# Patient Record
Sex: Male | Born: 1985 | Race: White | Hispanic: No | Marital: Single | Smoking: Former smoker
Health system: Southern US, Community
[De-identification: ages and names within clinical notes are randomized; demographics above are authoritative.]

## PROBLEM LIST (undated history)

## (undated) DIAGNOSIS — F419 Anxiety disorder, unspecified: Secondary | ICD-10-CM

## (undated) DIAGNOSIS — K5090 Crohn's disease, unspecified, without complications: Secondary | ICD-10-CM

## (undated) HISTORY — DX: Crohn's disease, unspecified, without complications: K50.90

## (undated) HISTORY — DX: Anxiety disorder, unspecified: F41.9

## (undated) HISTORY — PX: SMALL INTESTINE SURGERY: SHX150

---

## 2008-02-23 HISTORY — PX: MOUTH SURGERY: SHX715

## 2010-06-10 ENCOUNTER — Ambulatory Visit (INDEPENDENT_AMBULATORY_CARE_PROVIDER_SITE_OTHER): Payer: Self-pay | Admitting: Gastroenterology

## 2010-06-10 ENCOUNTER — Encounter: Payer: Self-pay | Admitting: Gastroenterology

## 2010-06-10 DIAGNOSIS — R109 Unspecified abdominal pain: Secondary | ICD-10-CM

## 2010-06-10 NOTE — Progress Notes (Signed)
Referring Provider: [facility] Primary Care Physician:   Primary Gastroenterologist:  Dr. Jena Gauss  Chief Complaint  Patient presents with  . Crohn's Disease    new diagnosis, possible CDIFF    HPI:  Shane Stone is a 25 y.o. male here as a referral from [facility] to establish care for new diagnosis of Crohn's. Pt states one week ago presented to [facility] with abdominal/back pain/LLQ discomfort. Severe. +diarrhea when presented to ED. 2 prior episodes of moderate amount of paper hematochezia and in stool. Now reports 1 BM per day, occasional nausea. No diarrhea. Reportedly, a colonoscopy was performed at [facility] as well as CT. Do not have any of these records at time of visit. Was started on entocort 9 mg and pentasa. Avoiding NSAIDs, yet he was taking ibuprofen prior to hospital admission. Denies lack of appetite or wt loss.  Pain is slightly improved since d/c. However, he still have intermittent discomfort. Had been taking percocet 3 tabs for about a day. Concerned this may be too much. Reviewed with pt tylenol guidelines as well as percocet administration.   Past Medical History  Diagnosis Date  . Crohn's     Past Surgical History  Procedure Date  . Mouth surgery 2010    Current Outpatient Prescriptions  Medication Sig Dispense Refill  . budesonide (ENTOCORT EC) 3 MG 24 hr capsule Take 9 mg by mouth every morning.        . mesalamine (PENTASA) 500 MG CR capsule Take 1,000 mg by mouth 4 (four) times daily.        Marland Kitchen oxyCODONE-acetaminophen (PERCOCET) 5-325 MG per tablet Take 2 tablets by mouth every 4 (four) hours as needed.          Allergies as of 06/10/2010  . (No Known Allergies)    Family History  Problem Relation Age of Onset  . Colon cancer Neg Hx   . Crohn's disease Neg Hx   . Inflammatory bowel disease Neg Hx     History   Social History  . Marital Status: Single    Spouse Name: N/A    Number of Children: N/A  . Years of Education: N/A    Occupational History  . Not on file.   Social History Main Topics  . Smoking status: Current Everyday Smoker -- 1.0 packs/day for 9 years    Types: Cigarettes  . Smokeless tobacco: Never Used  . Alcohol Use: No  . Drug Use: No  . Sexually Active: Yes -- Male partner(s)    Birth Control/ Protection: Condom     Ex-girlfriend    Review of Systems: Gen: Denies any fever, chills, sweats, anorexia, fatigue, weakness, malaise, weight loss, and sleep disorder CV: Denies chest pain, angina, palpitations, syncope, orthopnea, PND, peripheral edema, and claudication. Resp: Denies dyspnea at rest, dyspnea with exercise, cough, sputum, wheezing, coughing up blood, and pleurisy. GI: Denies vomiting blood, jaundice, and fecal incontinence.   Denies dysphagia or odynophagia. GU : Denies urinary burning, blood in urine, urinary frequency, urinary hesitancy, nocturnal urination, and urinary incontinence. MS: Denies joint pain, limitation of movement, and swelling, stiffness, low back pain, extremity pain. Denies muscle weakness, cramps, atrophy.  Derm: Denies rash, itching, dry skin, hives, moles, warts, or unhealing ulcers.  Psych: Denies depression, anxiety, memory loss, suicidal ideation, hallucinations, paranoia, and confusion. Heme: Denies bruising, bleeding, and enlarged lymph nodes.  Physical Exam: BP 129/79  Pulse 85  Temp 98.2 F (36.8 C)  Ht 5\' 7"  (1.702 m)  Wt 138  lb (62.596 kg)  BMI 21.61 kg/m2 General:   Alert,  Well-developed, well-nourished, pleasant and cooperative in NAD Head:  Normocephalic and atraumatic. Eyes:  Sclera clear, no icterus.   Conjunctiva pink. Ears:  Normal auditory acuity. Nose:  No deformity, discharge,  or lesions. Mouth:  No deformity or lesions, dentition normal. Lungs:  Clear throughout to auscultation.   No wheezes, crackles, or rhonchi. No acute distress. Heart:  Regular rate and rhythm; no murmurs, clicks, rubs,  or gallops. Abdomen:  Soft,  nontender and nondistended. No masses, hepatosplenomegaly or hernias noted. Normal bowel sounds, without guarding, and without rebound.   Msk:  Symmetrical without gross deformities. Normal posture. Extremities:  Without clubbing or edema. Neurologic:  Alert and  oriented x4;  grossly normal neurologically. Skin:  Intact without significant lesions or rashes. Cervical Nodes:  No significant cervical adenopathy. Psych:  Alert and cooperative. Normal mood and affect.

## 2010-06-10 NOTE — Patient Instructions (Signed)
We will retrieve the records from [place].  Continue on your current medications; we will see you back in 4 weeks.  You have been given a prescription for pain medication. If you have any increased abdominal pain, nausea or vomiting, fever, you need to seek medical attention immediately.  Follow a clear liquid diet for another 5-7 days. This will give your bowels a rest. After this, you may advance slowly to a low-residue diet.  Return in 4 weeks.

## 2010-06-15 ENCOUNTER — Telehealth: Payer: Self-pay

## 2010-06-15 DIAGNOSIS — R109 Unspecified abdominal pain: Secondary | ICD-10-CM | POA: Insufficient documentation

## 2010-06-15 NOTE — Progress Notes (Signed)
Cc to PCP 

## 2010-06-15 NOTE — Telephone Encounter (Signed)
Pt called and said he has been having very bad abd pain and his abdomen is hard as a rock. He requested a call from Gerrit Halls, NP.

## 2010-06-15 NOTE — Telephone Encounter (Signed)
Spoke with pt. He states his abdomen is "as hard as a brick". Rates abdominal pain 8/10. Diffuse. "stabbing". Denies constipation. BM once per day. NO rectal bleeding. No nausea. +flatus. Afebrile.  Instructed needed to have medical evaluation. Recommended AAS here. Pt unsure if can come here. Instructed to then go to ED in [place].   Pt will call back with his decision after speaking with his mom, who is his transportation. Informed it was imperative he be evaluated in light of his symptoms. Stated understanding.

## 2010-06-15 NOTE — Assessment & Plan Note (Signed)
25 year old Caucasian male with recent admission to [facility] with suspected Crohn's. No records currently available at the time of visit. Pt feels somewhat improved since admission, but still complaining of intermittent discomfort. On entocort 9 mg and pentasa. Denies loose stools. 1 BM /day. No melena or brbpr. Need records in order to proceed with further recommendations.  Retrieve records from [place]. In the interim, do the following: Clear liquid diet for another 5-7 days, then low residue diet Continue current medications Oxycodone 5 mg tabs, take 1-2 tabs po q 4-6 hours, disp: 30 with no refills provided to pt.  Seek medical attention immediately if increased abdominal pain, N/V, fever. Further rec's to follow in the very near future regarding management Return in 4 weeks.

## 2010-06-16 ENCOUNTER — Telehealth: Payer: Self-pay | Admitting: Gastroenterology

## 2010-06-16 ENCOUNTER — Emergency Department (HOSPITAL_COMMUNITY)
Admission: EM | Admit: 2010-06-16 | Discharge: 2010-06-16 | Disposition: A | Discharge status: Home / Self Care | Payer: Self-pay | Attending: Emergency Medicine | Admitting: Emergency Medicine

## 2010-06-16 ENCOUNTER — Emergency Department (HOSPITAL_COMMUNITY): Payer: Self-pay

## 2010-06-16 DIAGNOSIS — Z79899 Other long term (current) drug therapy: Secondary | ICD-10-CM | POA: Insufficient documentation

## 2010-06-16 DIAGNOSIS — K5080 Crohn's disease of both small and large intestine without complications: Secondary | ICD-10-CM | POA: Insufficient documentation

## 2010-06-16 DIAGNOSIS — R109 Unspecified abdominal pain: Secondary | ICD-10-CM | POA: Insufficient documentation

## 2010-06-16 LAB — COMPREHENSIVE METABOLIC PANEL
ALT: 10 U/L (ref 0–53)
AST: 10 U/L (ref 0–37)
Albumin: 3 g/dL — ABNORMAL LOW (ref 3.5–5.2)
Alkaline Phosphatase: 92 U/L (ref 39–117)
BUN: 8 mg/dL (ref 6–23)
CO2: 27 mEq/L (ref 19–32)
Calcium: 9.5 mg/dL (ref 8.4–10.5)
Chloride: 101 mEq/L (ref 96–112)
Creatinine, Ser: 0.67 mg/dL (ref 0.4–1.5)
GFR calc Af Amer: >60 mL/min (ref >60)
GFR calc non Af Amer: >60 mL/min (ref >60)
Glucose, Bld: 110 mg/dL — ABNORMAL HIGH (ref 70–99)
Potassium: 4 mEq/L (ref 3.5–5.1)
Sodium: 138 mEq/L (ref 135–145)
Total Bilirubin: 0.2 mg/dL — ABNORMAL LOW (ref 0.3–1.2)
Total Protein: 8.1 g/dL (ref 6.0–8.3)

## 2010-06-16 LAB — DIFFERENTIAL
Basophils Absolute: 0 10*3/uL (ref 0.0–0.1)
Basophils Relative: 0 % (ref 0–1)
Eosinophils Absolute: 0 10*3/uL (ref 0.0–0.7)
Eosinophils Relative: 0 % (ref 0–5)
Lymphocytes Relative: 16 % (ref 12–46)
Lymphs Abs: 1.7 10*3/uL (ref 0.7–4.0)
Monocytes Absolute: 0.5 10*3/uL (ref 0.1–1.0)
Monocytes Relative: 5 % (ref 3–12)
Neutro Abs: 8.3 10*3/uL — ABNORMAL HIGH (ref 1.7–7.7)
Neutrophils Relative %: 79 % — ABNORMAL HIGH (ref 43–77)

## 2010-06-16 LAB — CBC
HCT: 38.6 % — ABNORMAL LOW (ref 39.0–52.0)
Hemoglobin: 12.4 g/dL — ABNORMAL LOW (ref 13.0–17.0)
MCH: 27.1 pg (ref 26.0–34.0)
MCHC: 32.1 g/dL (ref 30.0–36.0)
MCV: 84.5 fL (ref 78.0–100.0)
Platelets: 542 10*3/uL — ABNORMAL HIGH (ref 150–400)
RBC: 4.57 MIL/uL (ref 4.22–5.81)
RDW: 13.5 % (ref 11.5–15.5)
WBC: 10.5 10*3/uL (ref 4.0–10.5)

## 2010-06-16 NOTE — Telephone Encounter (Signed)
Received records from [place]. It appears no firm diagnosis was made regarding Crohn's disease. No biopsy done at time of colonoscopy, in fact, findings of colonoscopy were normal, but the TI was not accessed. Unclear why. Also appears  C-diff positive.  Heme + CT with contrast: interval inflammation with mural thickening and adjacent mesenteric edema of multiple loops of ileum to include terminal ileum. Lesser inflammation sigmoid colon.  Hgb 4/17: 10.4, Hct 32.3  Contact pt at home to get progress report. Presented to ED this morning. AAS without significant findings. Afebrile. Abdominal pain improved. Waxes and wanes. Informed most likely we would be repeating a colonoscopy in the very near future in order to obtain actual biopsies. I told pt this would be discussed tomorrow morning with Dr. Jena Gauss. Stated understanding. He is in no distress. On the way to a ball game.

## 2010-06-17 ENCOUNTER — Encounter: Payer: Self-pay | Admitting: Gastroenterology

## 2010-06-17 NOTE — Progress Notes (Unsigned)
Discussed pt's case with Dr. Jena Gauss. We will proceed with CT abd/pelvis here at [facility]. Hopefully, will be able to compare images done at [facility]. Ultimately, pt will need to have a colonoscopy. First, need to assess for any acute issues prior to proceeding with invasive approach.  1. Please set pt up for a CT scan THIS week.  2. Obtain actual films from [facility] (CT) 3. Inform pt of plan regarding CT first, then likely setting up colonoscopy next week. If CT done this week and ok, will tentatively plan on colonoscopy next week.

## 2010-06-18 NOTE — Progress Notes (Signed)
CT requested on a disk

## 2010-06-19 NOTE — Progress Notes (Signed)
Tried to call pt- number would not go through 

## 2010-06-23 ENCOUNTER — Encounter: Payer: Self-pay | Admitting: Gastroenterology

## 2010-06-23 NOTE — Progress Notes (Unsigned)
We need to send pt a letter. He needs to have a CT of his abd/pelvis as soon as possible. He is scheduled to see me May 10th. Please inform him it is imperative that he follows through with this. I know we have tried to contact him several times. He ultimately needs a colonoscopy.

## 2010-06-23 NOTE — Progress Notes (Signed)
Tried to call pt- LMOM 

## 2010-06-24 NOTE — Progress Notes (Signed)
Tried to call pt- call would not go through. Will mail letter.

## 2010-06-24 NOTE — Progress Notes (Signed)
Sent letter to pt, asked him to call back and ask for Crystal to schedule CT.

## 2010-06-24 NOTE — Progress Notes (Signed)
Sent letter to pt

## 2010-06-29 ENCOUNTER — Other Ambulatory Visit: Payer: Self-pay | Admitting: Internal Medicine

## 2010-06-29 ENCOUNTER — Encounter: Payer: Self-pay | Admitting: Gastroenterology

## 2010-06-29 ENCOUNTER — Ambulatory Visit (HOSPITAL_COMMUNITY)
Admission: RE | Admit: 2010-06-29 | Discharge: 2010-06-29 | Disposition: A | Discharge status: Home / Self Care | Payer: Self-pay | Source: Ambulatory Visit | Attending: Internal Medicine | Admitting: Internal Medicine

## 2010-06-29 ENCOUNTER — Telehealth: Payer: Self-pay

## 2010-06-29 DIAGNOSIS — K5090 Crohn's disease, unspecified, without complications: Secondary | ICD-10-CM | POA: Insufficient documentation

## 2010-06-29 DIAGNOSIS — R933 Abnormal findings on diagnostic imaging of other parts of digestive tract: Secondary | ICD-10-CM | POA: Insufficient documentation

## 2010-06-29 DIAGNOSIS — R109 Unspecified abdominal pain: Secondary | ICD-10-CM | POA: Insufficient documentation

## 2010-06-29 MED ORDER — IOHEXOL 300 MG/ML  SOLN
100.0000 mL | Freq: Once | INTRAMUSCULAR | Status: AC | PRN
  Administered 2010-06-29: 100 mL via INTRAVENOUS

## 2010-06-29 NOTE — Progress Notes (Signed)
Pt is scheduled for STAT CT today- he is aware
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
## 2010-06-29 NOTE — Telephone Encounter (Signed)
Pt instructed to obtain CT today. Pentasa, Entocort written for pt. Vicodin 5/500 mg 1 po q 6 hours prn pain, disp# 30 given as well. We will review CT when available today and contact pt with further instructions.

## 2010-06-29 NOTE — Progress Notes (Unsigned)
Reviewed CT with Dr. Jena Gauss. Pt ultimately needs colonoscopy for biopsies. Most likely, as CT reported, active Crohn's disease. Needs to remain on Entocort for now. Pentasa will likely be stopped after colonoscopy completed. Due to possible fistula, pt will need biological agent in the future. In the interim, please do the following:  1. Obtain PPD test (in anticipation of upcoming therapy) 2. Set up colonoscopy this week or next 3. Tell pt to stick to low-residue diet. If needed, back down to clear liquid diet.  4. Any acute N/V, increased abdominal pain, abdominal distention, seek medical attention.

## 2010-06-30 ENCOUNTER — Encounter: Payer: Self-pay | Admitting: Internal Medicine

## 2010-06-30 ENCOUNTER — Telehealth: Payer: Self-pay

## 2010-06-30 NOTE — Progress Notes (Signed)
Pt is scheduled for TCS on 07/07/10- instructions mailed

## 2010-06-30 NOTE — Progress Notes (Signed)
Pt aware, stated the vicodin is not doing him any good and it requesting a different pain med. Advised if pain was too bad he needed to go to ED. Please advise.

## 2010-06-30 NOTE — Telephone Encounter (Signed)
RMR wrote rx for oxycodone 7.5 #30. Made copy to be scanned into [facility]. Tried to call pt to inform him and his cell phone was turned off.

## 2010-06-30 NOTE — Progress Notes (Signed)
Pt is aware of his OV for 07/02/10 at 3 pm with AS

## 2010-07-01 MED ORDER — OXYCODONE-ACETAMINOPHEN 10-325 MG PO TABS: 1.0000 | ORAL_TABLET | ORAL | Status: AC | PRN

## 2010-07-01 NOTE — Telephone Encounter (Signed)
Pt picked up rx yesterday and took to pharmacy- oxycodone not available in 7.5. Pt is requesting oxycodone combined with 325mg  tylenol. Spoke with RMR- ok to have LSL or KJ write for oxycodone 10/325mg  1 po q4 hours prn #30, 0RF. Pt aware and will come and pick up rx.

## 2010-07-01 NOTE — Telephone Encounter (Signed)
Forwarding to RMR to [employer]

## 2010-07-01 NOTE — Telephone Encounter (Signed)
Pt picked up rx

## 2010-07-01 NOTE — Telephone Encounter (Signed)
Rx printed & signed.  Please have pt pick up.

## 2010-07-02 ENCOUNTER — Telehealth: Payer: Self-pay

## 2010-07-02 ENCOUNTER — Ambulatory Visit (INDEPENDENT_AMBULATORY_CARE_PROVIDER_SITE_OTHER): Payer: Self-pay | Admitting: Gastroenterology

## 2010-07-02 DIAGNOSIS — K5090 Crohn's disease, unspecified, without complications: Secondary | ICD-10-CM

## 2010-07-02 NOTE — Telephone Encounter (Addendum)
KJ wrote Rx and pt came and picked it up.  Message copied by Hendricks Limes on Thu Jul 02, 2010 10:02 AM ------      Message from: Roetta Sessions      Created: Wed Jul 01, 2010  3:37 PM       Ok; lets go with oxycodone 10 mg with acetaminophen 325 mg tabs - #30 - one po every 4 hrs prn pain//no refills.      ----- Message -----         From: Evalee Mutton, LPN         Sent: 07/01/2010   8:38 AM           To: Corbin Ade, MD            Pt called- pharmacy told him that there was only 5mg  and 10mg  oxycodone, unless you add the tylenol. They need to know what we want to do. Please advise.

## 2010-07-06 ENCOUNTER — Other Ambulatory Visit: Payer: Self-pay | Admitting: General Practice
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
## 2010-07-06 NOTE — Telephone Encounter (Signed)
Pt called on Friday 07/03/10 at 1:37pm and left voicemail- pt stated he was going to come by to pick up his paperwork about his procedure on Tuesday but he doesn't have his car and is waiting for someone to bring him up here.  Stated he didn't have his pain and stomach medicine because his sisters boyfriend had it in his car and he lives in [place]. He is requesting refills. Pt was just given new rxs by Tobi Bastos for the entocort and the pentasa last week along with Vicodin. RMR gave pt Oxycodone last week. Pt is scheduled for tcs on Tuesday 07/07/10 at 12:45.

## 2010-07-06 NOTE — Telephone Encounter (Signed)
07/02/10-   I spoke with pt regarding his instructions for his TCS, he stated that the mailing address that he gave [place] was not where he had received his mail. I changed the address and sent out new instructions. I also asked Shane Stone to come in the office on 07/03/10 so I could go over the prep instructions in person because I wanted to make sure he had his prep instructions and Rx, he stated that he would be be by. I informed him that he needed to come by between 8-12, he stated his understanding.

## 2010-07-07 ENCOUNTER — Telehealth: Payer: Self-pay

## 2010-07-07 ENCOUNTER — Telehealth: Payer: Self-pay | Admitting: Internal Medicine

## 2010-07-07 ENCOUNTER — Encounter: Payer: Self-pay | Admitting: Internal Medicine

## 2010-07-07 NOTE — Telephone Encounter (Signed)
Shane Stone, please see RMR note.

## 2010-07-07 NOTE — Telephone Encounter (Signed)
Pt called wanting more pain meds. He started crying, stating he was in pain. I advised pt to go to ED. I also spoke with Tobi Bastos who agreed with this plan. Pt stated he would.

## 2010-07-07 NOTE — Telephone Encounter (Signed)
He can be re-scheduled; I feel there is a compliance issue here; if he cancels out/ no shows one more time, he should probably pursue his GI care elsewhere.

## 2010-07-07 NOTE — Telephone Encounter (Signed)
Pt called and stated he he did not have transportation & wanted to R/S his procedure. He is now scheduled for 07/13/10. I went over instructions with him again and advised him I had mailed him 2 different copies of the instructions and also faxed a copy over with his Rx to [facility]. Shane Stone stated his understanding. He also stated he wasn't sure why he needed to have the TCS in the first place in order to get his pain meds but stated he would be there on 07/13/10.

## 2010-07-07 NOTE — Telephone Encounter (Signed)
Pt called- left voicemail- he stated he cannot get anyone to bring him to the hospital or take him home. Pt stated he wanted to reschedule his tcs.

## 2010-07-08 NOTE — Progress Notes (Signed)
Opened in error

## 2010-07-13 ENCOUNTER — Encounter: Payer: Self-pay | Admitting: Internal Medicine

## 2010-07-13 ENCOUNTER — Encounter: Payer: Self-pay | Admitting: Gastroenterology

## 2010-07-13 ENCOUNTER — Other Ambulatory Visit: Payer: Self-pay | Admitting: Internal Medicine

## 2010-07-13 ENCOUNTER — Ambulatory Visit (HOSPITAL_COMMUNITY)
Admission: RE | Admit: 2010-07-13 | Discharge: 2010-07-13 | Disposition: A | Discharge status: Home / Self Care | Payer: Self-pay | Source: Ambulatory Visit | Attending: Internal Medicine | Admitting: Internal Medicine

## 2010-07-13 DIAGNOSIS — K5090 Crohn's disease, unspecified, without complications: Secondary | ICD-10-CM

## 2010-07-13 DIAGNOSIS — R1031 Right lower quadrant pain: Secondary | ICD-10-CM

## 2010-07-13 DIAGNOSIS — K5289 Other specified noninfective gastroenteritis and colitis: Secondary | ICD-10-CM

## 2010-07-13 DIAGNOSIS — R634 Abnormal weight loss: Secondary | ICD-10-CM | POA: Insufficient documentation

## 2010-07-13 DIAGNOSIS — R197 Diarrhea, unspecified: Secondary | ICD-10-CM | POA: Insufficient documentation

## 2010-07-13 NOTE — Progress Notes (Unsigned)
  Pt underwent colonoscopy today with biopsies. Will need to start Remicade. Pt was already instructed to obtain PPD; we do not have these results as of today. Prior to initiating treatment, need the following:  1. PPD results for our review (scan into chart as well) 2. HBsAg results  Will need CBC, HFP at baseline, then after initial infusion the following: CBC every 2 weeks X 2, then 1 month, then 3 months.  Will dose 5mg /kg at 0, 2, 6 weeks. Then, 8 weeks thereafter.  Dose: 310 mg IV.

## 2010-07-14 NOTE — Progress Notes (Signed)
Pt is aware, he cannot get tb test done until he gets paid on June 2nd. He stated his last tb test was done last year.  Advised him to let us know when he gets it done and have results sent to [place]. Informed him of labs and got address where I can mail it and he will get it. ([address] [place], [place] [address]) He also gave me another phone number [phone] and stated we can leave messages or speak with his aunt- Mila Homer.

## 2010-07-14 NOTE — Op Note (Signed)
NAME:  Shane Stone, Shane Stone               ACCOUNT NO.:  [id number]  MEDICAL RECORD NO.:  [id number]           PATIENT TYPE:  O  LOCATION:  DAYP                          FACILITY:  [facility]  PHYSICIAN:  R. Roetta Sessions, M.D. DATE OF BIRTH:  21-Feb-1986  DATE OF PROCEDURE:  07/13/2010 DATE OF DISCHARGE:                              OPERATIVE REPORT   SURGEON:  R. Roetta Sessions, MD  PROCEDURE:  Ileal colonoscopy with biopsy.  INDICATIONS FOR PROCEDURE:  A 25 year old gentleman with weight loss, right lower quadrant abdominal pain, some diarrhea over the past few months, admitted to [facility] in [place].  Recently CT x2 demonstrated inflammatory changes about the ileum concerning for Crohn disease.  Colonoscopy at [facility] was unyielding as far as the ileum is concerned as the GI was not intubated.  He is not significantly improved on Pentasa and Entocort.  Ileal colonoscopy is now being done to substantiate the diagnosis before pursuing biologic therapy.  PPD skin test is in progress.  Risks, benefits, limitations, alternatives, and imponderables have been reviewed, questions were answered.  Please see the documentation of the medical record.  PROCEDURE NOTE:  O2 saturation, blood pressure, pulse, and respirations were monitored throughout the entirety of the procedure.  CONSCIOUS SEDATION: 1. Versed 12 mg IV. 2. Demerol 200 mg IV. 3. Phenergan 25 mg dilute slow IV push to augment conscious sedation.  INSTRUMENT:  Pentax video chip system, pediatric colonoscope.  FINDINGS:  Digital rectal exam revealed no abnormalities.  Endoscopic findings:  Prep was adequate.  Colon:  Colonic mucosa was surveyed from the rectosigmoid junction through the left transverse right colon to the appendiceal orifice, ileocecal valve/cecum.  These structures were well seen and photographed for the record.  The terminal ileum was intubated to good 15 cm.  Examination of the terminal ileum revealed  multiple 1-2 mm ulcers from the orifice of the ileocecal valve up to about 10-15 cm, at which level, there was a marked stricture formation, mark compromise of the lumen until approximately 3-4 mm.  I could not advance the scope up into the stricture.  I passed the biopsy forceps into the stricture, took multiple biopsies and biopsied the ulcers in the more distal terminal ileum.  The scope was pulled back into the colon where the colon was reexamined and the colon along its entire length appeared normal, mucosa appeared entirely normal.  Scope was pulled down into the rectum where through examination of the rectal mucosa including retroflexed view of the anal verge demonstrated no abnormalities.  The patient tolerated the procedure well.  Cecal withdrawal time 11 minutes.  IMPRESSION:  Normal rectum and colon, markedly abnormal terminal ileum as described above, status post biopsy.  I suspect we are dealing with Crohn disease.  Wrap of PPD.  PLAN:  Biologic therapy in the way of Remicade, may also consider steroid-sparing therapy in the way of Imuran or 6-MP in the near future. Further recommendations to follow pending review of path.     Jonathon Bellows, M.D.     RMR/MEDQ  D:  07/13/2010  T:  07/14/2010  Job:  [id number]  Electronically Signed by Lorrin Goodell M.D. on 07/14/2010 07:40:55 AM

## 2010-07-15 NOTE — Progress Notes (Signed)
Pt called- stated RMR gave him some pain pills after his procedure and he had to double up on them because of the pain. I explained to pt that we needed ppd done asap and once he started on remicade he would feel better and not need the pain pills anymore. Pt stated he was going to health department for ppd on June 4th because he will have some money by then. Pt does not have a pcp. I called health dept today and they told me it was $20.00 for ppd. I informed pt of that. He does not have any insurance, pt said he has a trust fund that pays his medical bills. I am working on getting pt remicade through a specialty pharmacy that will help with pt assistance. Pt is requesting a refill on his pain meds and would like [facility] [place]. Please advise

## 2010-07-15 NOTE — Progress Notes (Signed)
Agree. Pt needs to complete labs/PPD as already instructed. Pt was instructed on PPD a few weeks ago. We will give short course of vicodin. May call in vicodin 5/500 1 tab po every 4-6 hours prn pain. Disp# 30. No refills. Do not exceed 4g/day of tylenol.   Pt needs to start on therapy as soon as possible. Please suggest that he borrow the 20$ from family/friends in order to obtain the PPD, labs. I am unsure how much the pain medication costs out of pocket, but he can not be managed without appropriate therapy.

## 2010-07-16 NOTE — Progress Notes (Signed)
Pt aware, he will see if he can do ppd sooner. I have faxed rx info to [facility] to see about pt getting assistance with cost. vicodin rx [facility].

## 2010-07-21 ENCOUNTER — Other Ambulatory Visit: Payer: Self-pay | Admitting: Gastroenterology

## 2010-07-21 ENCOUNTER — Telehealth: Payer: Self-pay

## 2010-07-21 DIAGNOSIS — K5090 Crohn's disease, unspecified, without complications: Secondary | ICD-10-CM

## 2010-07-21 MED ORDER — OXYCODONE-ACETAMINOPHEN 10-325 MG PO TABS: 1.0000 | ORAL_TABLET | Freq: Four times a day (QID) | ORAL | Status: AC | PRN

## 2010-07-21 NOTE — Telephone Encounter (Signed)
I spoke with pt at length today. I informed him that he has active disease and needs to be started on Remicade; however, this can not be done without first proceeding with a PPD and baseline labs. I informed him that he is putting himself at risk and discussed possible future complications in detail. This seemed to alert him to the need for following through on our recommendations, as he began to ask more questions regarding the PPD test and the labs needed.    At one point in the conversation, he stated that he could pay the 20$ for the PPD test; however, he had told us previously he was unable to complete until June 4th or 5th. I asked about his method of payment, and he states he uses a trust fund and could give [place] the name and phone # of the man who resides over this. I asked if he could use that money for the PPD test. Again, he stated he could not come until next week, regardless.  I informed him that I could not prescribe anything over the phone stronger than the Vicodin he had been receiving. Again, I told him that starting on treatments would greatly reduce his need for pain medication and actively treat the disease that is causing so much discomfort. He was told that if he needed something stronger, it would require him picking this up. I said if he is coming this way to pick the rx up, he should find a way somehow to stop by the health department for the PPD test, ultimately saving him/his friends gas money. I asked again that he borrow 20$ from a friend if necessary in order to complete this.  This is the last narcotic prescription to be given, and he was informed several times of this. We have discussed a goal of completing the PPD test and labs by June 4th, in the hopes of starting the treatment next week. He is agreeable to this and understands.  Percocet 10/325, 1 every 6 hours, disp# 40 printed for pt to pick up. Compliance is a significant issue at this time.

## 2010-07-21 NOTE — Telephone Encounter (Signed)
Pt called x3. He is out of his pain medication and is requesting a refill because he is in a lot of pain. Pt is requesting something without a lot of tylenol in it. Please advise

## 2010-07-22 NOTE — Telephone Encounter (Signed)
Pt came and picked up Rx. Gave him lab orders and pt assistance forms for remicade. Advised pt on how to fill out forms and that we needed to get them back asap so we would have the medicine when it was time to start his treatments.

## 2010-07-27 ENCOUNTER — Telehealth: Payer: Self-pay

## 2010-07-27 NOTE — Telephone Encounter (Addendum)
Called [facility] Dept- pt did not show up today for his TB test and he has not brought back the patient assistance forms that I gave him last week. Pt also has not had labs drawn, gave him order last week when he picked up rx, and pt assistance forms.

## 2010-07-28 NOTE — Telephone Encounter (Signed)
Pt has continued to be non-compliant. Percocet given to pt last week, with pt thoroughly counseled this would be the last prescription for narcotics.  He has been advised multiple times to proceed with TB test and blood tests necessary in order to start Humira.  Will need to d/w Dr. Jena Gauss due to continued non-compliance.

## 2010-07-30 ENCOUNTER — Telehealth: Payer: Self-pay

## 2010-07-30 NOTE — Telephone Encounter (Signed)
Pt called- left voicemail- stated he cannot get tb test done until next Tuesday. He stated he was having abd pain and a new pain that was on his side and goes down his leg. He said he cannot get out of bed and cannot bend over. Pt is requesting pain rx.

## 2010-07-30 NOTE — Telephone Encounter (Signed)
Pt has been non-compliant with our recommendations consistently. Absolutely no more narcotics. Dr. Jena Gauss has been notified, and this is per his recommendations. He will be writing letter to send to pt. Pt is to receive no more narcotics.

## 2010-07-31 ENCOUNTER — Encounter: Payer: Self-pay | Admitting: Internal Medicine

## 2010-07-31 NOTE — Telephone Encounter (Signed)
Pt aware.

## 2010-08-02 ENCOUNTER — Encounter: Payer: Self-pay | Admitting: Internal Medicine

## 2010-08-06 ENCOUNTER — Telehealth: Payer: Self-pay

## 2010-08-06 NOTE — Telephone Encounter (Signed)
Pt has been repeatedly informed of the process by which to obtain his medication for treatment of Crohn's. However, he has continued to be non-compliant. He has been discharged by the practice, but we will cover him for 30 days for emergent needs.  However, no narcotics are to be given anymore. He has been told this specifically, and he had informed us he would be obtaining his labs and PPD on June 4 or 5. He did not follow through with this; he had been given enough pain medication to last through those days. He was informed (see prior phone notes) that this would be the last prescription for pain medication.  If he is having severe pain, bloating, etc as he says, he needs to seek medical attention. [facility] may contact our office if necessary. We can easily tell them what needs to be done (PPD test, starting doses for Remicade). This could easily be facilitated by their hospital; we have documented evidence of his disease.  Further recommendations can be made by Dr. Jena Gauss. He has continued to be non-compliant despite our thorough explanations of his disease process and potential outcomes.

## 2010-08-06 NOTE — Telephone Encounter (Signed)
Pt left voicemail at 4:05 this am. He stated he was having a lot of abd pain and bloating. He also stated his thighs and back hurt and that he has not slept in 2 days. He feels like his "stomach is flaring". He said he called the ambulance and they wont take him to [facility] only [facility] because he lives in [place]. He wants to know if there is anything we can do. Please advise.

## 2010-08-07 NOTE — Telephone Encounter (Signed)
Pt aware.

## 2010-09-07 ENCOUNTER — Encounter: Payer: Self-pay | Admitting: General Practice

## 2010-09-07 NOTE — Progress Notes (Signed)
I spoke with Shane Stone and explained the reason we are discharging Shane Stone from our practice,due to noncompliance issue.  He voiced understanding and stated he will contact his PCP for a referral to another GI doctor.

## 2013-10-30 ENCOUNTER — Encounter (HOSPITAL_COMMUNITY): Payer: Self-pay | Admitting: Emergency Medicine

## 2013-10-30 ENCOUNTER — Inpatient Hospital Stay (HOSPITAL_COMMUNITY)
Admission: EM | Admit: 2013-10-30 | Discharge: 2013-11-03 | DRG: 385 | Disposition: A | Discharge status: Home Health | Payer: Self-pay | Attending: General Surgery | Admitting: General Surgery

## 2013-10-30 ENCOUNTER — Emergency Department (HOSPITAL_COMMUNITY): Payer: Self-pay

## 2013-10-30 DIAGNOSIS — D638 Anemia in other chronic diseases classified elsewhere: Secondary | ICD-10-CM | POA: Diagnosis present

## 2013-10-30 DIAGNOSIS — K50014 Crohn's disease of small intestine with abscess: Secondary | ICD-10-CM | POA: Diagnosis present

## 2013-10-30 DIAGNOSIS — Z91199 Patient's noncompliance with other medical treatment and regimen due to unspecified reason: Secondary | ICD-10-CM

## 2013-10-30 DIAGNOSIS — K5000 Crohn's disease of small intestine without complications: Principal | ICD-10-CM | POA: Diagnosis present

## 2013-10-30 DIAGNOSIS — Z9119 Patient's noncompliance with other medical treatment and regimen: Secondary | ICD-10-CM

## 2013-10-30 DIAGNOSIS — R1031 Right lower quadrant pain: Secondary | ICD-10-CM

## 2013-10-30 DIAGNOSIS — K651 Peritoneal abscess: Secondary | ICD-10-CM | POA: Diagnosis present

## 2013-10-30 DIAGNOSIS — F17200 Nicotine dependence, unspecified, uncomplicated: Secondary | ICD-10-CM | POA: Diagnosis present

## 2013-10-30 DIAGNOSIS — D500 Iron deficiency anemia secondary to blood loss (chronic): Secondary | ICD-10-CM | POA: Diagnosis present

## 2013-10-30 LAB — BASIC METABOLIC PANEL
Anion gap: 14 (ref 5–15)
BUN: 24 mg/dL — ABNORMAL HIGH (ref 6–23)
CO2: 22 mEq/L (ref 19–32)
Calcium: 9 mg/dL (ref 8.4–10.5)
Chloride: 102 mEq/L (ref 96–112)
Creatinine, Ser: 1.33 mg/dL (ref 0.50–1.35)
GFR calc Af Amer: 82 mL/min — ABNORMAL LOW (ref >90)
GFR calc non Af Amer: 71 mL/min — ABNORMAL LOW (ref >90)
Glucose, Bld: 106 mg/dL — ABNORMAL HIGH (ref 70–99)
Potassium: 3.2 mEq/L — ABNORMAL LOW (ref 3.7–5.3)
Sodium: 138 mEq/L (ref 137–147)

## 2013-10-30 LAB — HEPATIC FUNCTION PANEL
ALT: <5 U/L (ref 0–53)
AST: 11 U/L (ref 0–37)
Albumin: 2.4 g/dL — ABNORMAL LOW (ref 3.5–5.2)
Alkaline Phosphatase: 82 U/L (ref 39–117)
Bilirubin, Direct: <0.2 mg/dL (ref 0.0–0.3)
Total Bilirubin: 0.2 mg/dL — ABNORMAL LOW (ref 0.3–1.2)
Total Protein: 7.6 g/dL (ref 6.0–8.3)

## 2013-10-30 LAB — CBC WITH DIFFERENTIAL/PLATELET
Basophils Absolute: 0 10*3/uL (ref 0.0–0.1)
Basophils Relative: 0 % (ref 0–1)
Eosinophils Absolute: 0 10*3/uL (ref 0.0–0.7)
Eosinophils Relative: 0 % (ref 0–5)
HCT: 25.3 % — ABNORMAL LOW (ref 39.0–52.0)
Hemoglobin: 7.2 g/dL — ABNORMAL LOW (ref 13.0–17.0)
Lymphocytes Relative: 15 % (ref 12–46)
Lymphs Abs: 1.5 10*3/uL (ref 0.7–4.0)
MCH: 19.4 pg — ABNORMAL LOW (ref 26.0–34.0)
MCHC: 28.5 g/dL — ABNORMAL LOW (ref 30.0–36.0)
MCV: 68.2 fL — ABNORMAL LOW (ref 78.0–100.0)
Monocytes Absolute: 0.8 10*3/uL (ref 0.1–1.0)
Monocytes Relative: 8 % (ref 3–12)
Neutro Abs: 7.7 10*3/uL (ref 1.7–7.7)
Neutrophils Relative %: 76 % (ref 43–77)
Platelets: 574 10*3/uL — ABNORMAL HIGH (ref 150–400)
RBC: 3.71 MIL/uL — ABNORMAL LOW (ref 4.22–5.81)
RDW: 18.6 % — ABNORMAL HIGH (ref 11.5–15.5)
WBC: 10 10*3/uL (ref 4.0–10.5)

## 2013-10-30 LAB — LIPASE, BLOOD: Lipase: 9 U/L — ABNORMAL LOW (ref 11–59)

## 2013-10-30 MED ORDER — ACETAMINOPHEN 325 MG PO TABS: 650.0000 mg | ORAL_TABLET | Freq: Four times a day (QID) | ORAL | Status: DC | PRN

## 2013-10-30 MED ORDER — HYDROCODONE-ACETAMINOPHEN 5-325 MG PO TABS
1.0000 | ORAL_TABLET | ORAL | Status: DC | PRN
  Filled 2013-10-30: qty 2

## 2013-10-30 MED ORDER — IOHEXOL 300 MG/ML  SOLN
50.0000 mL | Freq: Once | INTRAMUSCULAR | Status: AC | PRN
  Administered 2013-10-30: 50 mL via ORAL

## 2013-10-30 MED ORDER — CIPROFLOXACIN IN D5W 400 MG/200ML IV SOLN
400.0000 mg | Freq: Two times a day (BID) | INTRAVENOUS | Status: DC
  Administered 2013-10-30 – 2013-11-03 (×7): 400 mg via INTRAVENOUS
  Filled 2013-10-30 (×7): qty 200

## 2013-10-30 MED ORDER — KCL IN DEXTROSE-NACL 40-5-0.45 MEQ/L-%-% IV SOLN
INTRAVENOUS | Status: DC
  Administered 2013-10-30 – 2013-11-02 (×3): via INTRAVENOUS

## 2013-10-30 MED ORDER — ONDANSETRON HCL 4 MG/2ML IJ SOLN
4.0000 mg | Freq: Once | INTRAMUSCULAR | Status: AC
  Administered 2013-10-30: 4 mg via INTRAVENOUS
  Filled 2013-10-30: qty 2

## 2013-10-30 MED ORDER — NICOTINE 21 MG/24HR TD PT24
21.0000 mg | MEDICATED_PATCH | Freq: Every day | TRANSDERMAL | Status: DC
  Administered 2013-10-31 – 2013-11-02 (×3): 21 mg via TRANSDERMAL
  Filled 2013-10-30 (×5): qty 1

## 2013-10-30 MED ORDER — HYDROMORPHONE HCL PF 1 MG/ML IJ SOLN
1.0000 mg | Freq: Once | INTRAMUSCULAR | Status: AC
  Administered 2013-10-30: 1 mg via INTRAVENOUS
  Filled 2013-10-30: qty 1

## 2013-10-30 MED ORDER — ONDANSETRON HCL 4 MG/2ML IJ SOLN: 4.0000 mg | Freq: Once | INTRAMUSCULAR | Status: DC

## 2013-10-30 MED ORDER — SODIUM CHLORIDE 0.9 % IV BOLUS (SEPSIS)
2000.0000 mL | Freq: Once | INTRAVENOUS | Status: AC
  Administered 2013-10-30: 2000 mL via INTRAVENOUS

## 2013-10-30 MED ORDER — ALUM & MAG HYDROXIDE-SIMETH 200-200-20 MG/5ML PO SUSP
30.0000 mL | Freq: Four times a day (QID) | ORAL | Status: DC | PRN
  Administered 2013-10-31 – 2013-11-03 (×6): 30 mL via ORAL
  Filled 2013-10-30 (×6): qty 30

## 2013-10-30 MED ORDER — ONDANSETRON HCL 4 MG/2ML IJ SOLN
4.0000 mg | Freq: Four times a day (QID) | INTRAMUSCULAR | Status: DC | PRN
  Administered 2013-11-01: 4 mg via INTRAVENOUS
  Filled 2013-10-30: qty 2

## 2013-10-30 MED ORDER — DIPHENHYDRAMINE HCL 50 MG/ML IJ SOLN: 12.5000 mg | Freq: Four times a day (QID) | INTRAMUSCULAR | Status: DC | PRN

## 2013-10-30 MED ORDER — NICOTINE 21 MG/24HR TD PT24
21.0000 mg | MEDICATED_PATCH | TRANSDERMAL | Status: AC
  Administered 2013-10-30: 21 mg via TRANSDERMAL

## 2013-10-30 MED ORDER — DIPHENHYDRAMINE HCL 12.5 MG/5ML PO ELIX: 12.5000 mg | ORAL_SOLUTION | Freq: Four times a day (QID) | ORAL | Status: DC | PRN

## 2013-10-30 MED ORDER — METRONIDAZOLE IN NACL 5-0.79 MG/ML-% IV SOLN
500.0000 mg | Freq: Three times a day (TID) | INTRAVENOUS | Status: DC
  Administered 2013-10-31 – 2013-11-03 (×10): 500 mg via INTRAVENOUS
  Filled 2013-10-30 (×10): qty 100

## 2013-10-30 MED ORDER — ACETAMINOPHEN 650 MG RE SUPP
650.0000 mg | Freq: Four times a day (QID) | RECTAL | Status: DC | PRN
Start: 2013-10-30 — End: 2013-11-03

## 2013-10-30 MED ORDER — IOHEXOL 300 MG/ML  SOLN
100.0000 mL | Freq: Once | INTRAMUSCULAR | Status: AC | PRN
  Administered 2013-10-30: 100 mL via INTRAVENOUS

## 2013-10-30 MED ORDER — KETOROLAC TROMETHAMINE 30 MG/ML IJ SOLN: 30.0000 mg | Freq: Once | INTRAMUSCULAR | Status: DC

## 2013-10-30 MED ORDER — HYDROMORPHONE HCL PF 1 MG/ML IJ SOLN
1.0000 mg | INTRAMUSCULAR | Status: DC | PRN
  Administered 2013-10-30 – 2013-10-31 (×4): 1 mg via INTRAVENOUS
  Filled 2013-10-30 (×4): qty 1

## 2013-10-30 NOTE — ED Notes (Signed)
Pt. To be rescanned for CT at 1950 for contrast to be farther down to rule out abscess.

## 2013-10-30 NOTE — ED Notes (Signed)
Pt states has hx of crohns disease. C/o not eating/vomiting x 5 days. Diarrhea but states "normal for me" and denies it being any worse. Pt pale and moist. Denies blood in emesis or stool. Alert/oriented. gen weakness per pt. Mm wet.

## 2013-10-30 NOTE — ED Provider Notes (Signed)
CSN: [id number]     Arrival date & time 10/30/13  1501 History   First MD Initiated Contact with Patient 10/30/13 1603     Chief Complaint  Patient presents with  . Emesis  . Abdominal Pain     (Consider location/radiation/quality/duration/timing/severity/associated sxs/prior Treatment) Patient is a 28 y.o. male presenting with vomiting and abdominal pain. The history is provided by the patient (the pt complains of abd pain and vomiting for one week.  he has a hx of chrons dz).  Emesis Severity:  Moderate Timing:  Constant Quality:  Unable to specify Able to tolerate:  Liquids Progression:  Unchanged Chronicity:  Recurrent Recent urination:  Decreased Relieved by:  Nothing Associated symptoms: abdominal pain   Associated symptoms: no diarrhea and no headaches   Abdominal Pain Associated symptoms: vomiting   Associated symptoms: no chest pain, no cough, no diarrhea, no fatigue and no hematuria     Past Medical History  Diagnosis Date  . Crohn's    Past Surgical History  Procedure Laterality Date  . Mouth surgery  2010  . Small intestine surgery      from infection. no intestines removed.    Family History  Problem Relation Age of Onset  . Colon cancer Neg Hx   . Crohn's disease Neg Hx   . Inflammatory bowel disease Neg Hx    History  Substance Use Topics  . Smoking status: Current Every Day Smoker -- 1.00 packs/day for 9 years    Types: Cigarettes  . Smokeless tobacco: Never Used  . Alcohol Use: No    Review of Systems  Constitutional: Negative for appetite change and fatigue.  HENT: Negative for congestion, ear discharge and sinus pressure.   Eyes: Negative for discharge.  Respiratory: Negative for cough.   Cardiovascular: Negative for chest pain.  Gastrointestinal: Positive for vomiting and abdominal pain. Negative for diarrhea.  Genitourinary: Negative for frequency and hematuria.  Musculoskeletal: Negative for back pain.  Skin: Negative for rash.   Neurological: Negative for seizures and headaches.  Psychiatric/Behavioral: Negative for hallucinations.      Allergies  Review of patient's allergies indicates no known allergies.  Home Medications   Prior to Admission medications   Medication Sig Start Date End Date Taking? Authorizing Provider  ibuprofen (ADVIL,MOTRIN) 200 MG tablet Take 200 mg by mouth every 6 (six) hours as needed for headache, mild pain or moderate pain.   Yes Historical Provider, MD   BP 106/64  Pulse 96  Temp(Src) 98.6 F (37 C) (Oral)  Resp 18  Ht  (1.727 m)  Wt 140 lb (63.504 kg)  BMI 21.29 kg/m2  SpO2 100% Physical Exam  Constitutional: He is oriented to person, place, and time. He appears well-developed.  HENT:  Head: Normocephalic.  Eyes: Conjunctivae and EOM are normal. No scleral icterus.  Neck: Neck supple. No thyromegaly present.  Cardiovascular: Normal rate and regular rhythm.  Exam reveals no gallop and no friction rub.   No murmur heard. Pulmonary/Chest: No stridor. He has no wheezes. He has no rales. He exhibits no tenderness.  Abdominal: He exhibits no distension. There is tenderness. There is no rebound.  Tender rlq  Musculoskeletal: Normal range of motion. He exhibits no edema.  Lymphadenopathy:    He has no cervical adenopathy.  Neurological: He is oriented to person, place, and time. He exhibits normal muscle tone. Coordination normal.  Skin: No rash noted. No erythema.  Psychiatric: He has a normal mood and affect. His behavior is normal.  ED Course  Procedures (including critical care time) Labs Review Labs Reviewed  CBC WITH DIFFERENTIAL - Abnormal; Notable for the following:    RBC 3.71 (*)    Hemoglobin 7.2 (*)    HCT 25.3 (*)    MCV 68.2 (*)    MCH 19.4 (*)    MCHC 28.5 (*)    RDW 18.6 (*)    Platelets 574 (*)    All other components within normal limits  BASIC METABOLIC PANEL - Abnormal; Notable for the following:    Potassium 3.2 (*)    Glucose, Bld  106 (*)    BUN 24 (*)    GFR calc non Af Amer 71 (*)    GFR calc Af Amer 82 (*)    All other components within normal limits  HEPATIC FUNCTION PANEL  LIPASE, BLOOD    Imaging Review Ct Abdomen Pelvis W Contrast  10/30/2013   CLINICAL DATA:  History of Crohn's disease. Not eating well. Vomiting for 5 days.  EXAM: CT ABDOMEN AND PELVIS WITH CONTRAST  TECHNIQUE: Multidetector CT imaging of the abdomen and pelvis was performed using the standard protocol following bolus administration of intravenous contrast.  CONTRAST:  65mL OMNIPAQUE IOHEXOL 300 MG/ML SOLN, OMNIPAQUE IOHEXOL 300 MG/ML SOLN  COMPARISON:  CT abdomen pelvis - 10/04/2012; 06/29/2010; 09/26/2012; CT-guided abdominal abscess drainage catheter placement - 09/27/2012  FINDINGS: There is a serpiginous peripherally enhancing abscess within the right lower abdomen/pelvis which measures approximately 9.5 x 3.0 x 8.7 cm (as measured in greatest oblique axial and craniocaudal dimensions respectively -axial image 63, series 2, coronal image 18, series 4). There is fistulous extension of this abscess through the right lateral abdominal wall, likely along the course of the prior percutaneous catheter (representative axial images 51 and 54, series 2). An exact source of this large serpiginous abscess is not definitely determined though again, the etiology is suspected to originate nearly the cecum and/or terminal ileum. Note, the appendix is no longer imaged.  Ingested enteric contrast extends to the level of the mid small bowel. There is marked abnormal ball wall thickening involving the sigmoid colon (image 76, series 2) and several loops of small bowel within the left lower abdomen/pelvis (image 65, series 2). Bowel loops within the right lower abdominal quadrant are difficult to discern secondary to lack of enteric contrast, significant amount of mesenteric fat as well as the associated inflammatory change. No definite pneumatosis or portal venous  gas.  Normal hepatic contour. Normal appearance of the gallbladder. No radiopaque gallstones. There is a minimal amount of periportal edema. No definite intra or extrahepatic biliary duct dilatation. The main, right and left portal veins are widely patent. There is a trace amount of fluid adjacent to the caudal aspect of the right lobe of the liver.  There is symmetric enhancement and excretion of the bilateral kidneys. Incidental note is made of a punctate (approximately 2 mm) nonobstructing stone within the superior pole the left kidney (axial image 35, series 2). No urinary obstruction or perinephric stranding. Normal appearance of the bilateral adrenal glands and pancreas. The spleen remains enlarged, measuring approximately 16.1 cm in length (image 29, series 2). Interval increase in size of the now approximately 2.8 x 2.4 cm hypo attenuating (15 Hounsfield unit) cyst within the spleen (image 19, series 2, previously, 1.8 cm. No perisplenic stranding.  Normal caliber of the abdominal aorta. The major branch vessels of the abdominal aorta appear patent on this non CTA examination.  Worsening mesenteric lymphadenopathy  with index lymph nodes now measuring approximately 1.1 cm (image 52, series 2) and 1.6 cm (image 56), previously, 0.6 and 1.3 cm respectively, presumably reactive.  Limited visualization of the lower thorax is negative for focal airspace opacity or pleural effusion. Normal heart size. No pericardial effusion.  No acute or aggressive osseous abnormalities.  There is mild diffuse body wall anasarca appearing and  IMPRESSION: 1. Interval development of a recurrent serpiginous large (at least 9.5 cm) abscess centered within the right lower abdominal quadrant. There is fistulous connection of this abscess through the right lateral abdominal wall, along the course of the prior percutaneous drainage catheter placement. The etiology of this abscess is not depicted on this examination though presumably  originates from the cecum/terminal ileum as was demonstrated on prior abdominal CT performed 09/26/2012. Note however, the appendix is no longer visualized and as such, a concomitant appendicitis is not excluded. 2. Worsening presumably reactive mesenteric lymphadenopathy. 3. Punctate (approximately 2 mm) nonobstructing stone within the superior/mid aspect of the left kidney. Critical Value/emergent results were called by telephone at the time of interpretation on 10/30/2013 at 8:40 pm to Dr. Bethann Berkshire , who verbally acknowledged these results.   Electronically Signed   By: Simonne Come M.D.   On: 10/30/2013 20:45     EKG Interpretation None      MDM   Final diagnoses:  Right lower quadrant abdominal pain    Abscess abd.  Dr. Lovell Sheehan surgery to admit    Benny Lennert, MD 10/30/13 2101

## 2013-10-31 DIAGNOSIS — R1031 Right lower quadrant pain: Secondary | ICD-10-CM

## 2013-10-31 DIAGNOSIS — K5090 Crohn's disease, unspecified, without complications: Secondary | ICD-10-CM

## 2013-10-31 DIAGNOSIS — K630 Abscess of intestine: Secondary | ICD-10-CM

## 2013-10-31 LAB — CBC
HCT: 17.6 % — ABNORMAL LOW (ref 39.0–52.0)
Hemoglobin: 5 g/dL — CL (ref 13.0–17.0)
MCH: 19.4 pg — ABNORMAL LOW (ref 26.0–34.0)
MCHC: 28.4 g/dL — ABNORMAL LOW (ref 30.0–36.0)
MCV: 68.2 fL — ABNORMAL LOW (ref 78.0–100.0)
Platelets: 323 10*3/uL (ref 150–400)
RBC: 2.58 MIL/uL — ABNORMAL LOW (ref 4.22–5.81)
RDW: 18.5 % — ABNORMAL HIGH (ref 11.5–15.5)
WBC: 5.3 10*3/uL (ref 4.0–10.5)

## 2013-10-31 LAB — BASIC METABOLIC PANEL
Anion gap: 12 (ref 5–15)
BUN: 16 mg/dL (ref 6–23)
CO2: 22 mEq/L (ref 19–32)
Calcium: 8 mg/dL — ABNORMAL LOW (ref 8.4–10.5)
Chloride: 104 mEq/L (ref 96–112)
Creatinine, Ser: 1.12 mg/dL (ref 0.50–1.35)
GFR calc Af Amer: >90 mL/min (ref >90)
GFR calc non Af Amer: 87 mL/min — ABNORMAL LOW (ref >90)
Glucose, Bld: 101 mg/dL — ABNORMAL HIGH (ref 70–99)
Potassium: 3.1 mEq/L — ABNORMAL LOW (ref 3.7–5.3)
Sodium: 138 mEq/L (ref 137–147)

## 2013-10-31 LAB — ABO/RH: ABO/RH(D): A POS

## 2013-10-31 LAB — HEMOGLOBIN AND HEMATOCRIT, BLOOD
HCT: 28.7 % — ABNORMAL LOW (ref 39.0–52.0)
Hemoglobin: 8.8 g/dL — ABNORMAL LOW (ref 13.0–17.0)

## 2013-10-31 LAB — PROTIME-INR
INR: 1.56 — ABNORMAL HIGH (ref 0.00–1.49)
Prothrombin Time: 18.7 seconds — ABNORMAL HIGH (ref 11.6–15.2)

## 2013-10-31 LAB — APTT: aPTT: 47 seconds — ABNORMAL HIGH (ref 24–37)

## 2013-10-31 LAB — MAGNESIUM: Magnesium: 1.9 mg/dL (ref 1.5–2.5)

## 2013-10-31 LAB — PREPARE RBC (CROSSMATCH)

## 2013-10-31 LAB — PHOSPHORUS: Phosphorus: 3.7 mg/dL (ref 2.3–4.6)

## 2013-10-31 MED ORDER — HYDROMORPHONE HCL PF 1 MG/ML IJ SOLN
1.0000 mg | INTRAMUSCULAR | Status: DC | PRN
  Administered 2013-10-31 – 2013-11-03 (×28): 1 mg via INTRAVENOUS
  Filled 2013-10-31 (×28): qty 1

## 2013-10-31 MED ORDER — SODIUM CHLORIDE 0.9 % IV SOLN: Freq: Once | INTRAVENOUS | Status: DC

## 2013-10-31 MED ORDER — BOOST / RESOURCE BREEZE PO LIQD
1.0000 | Freq: Three times a day (TID) | ORAL | Status: DC
  Administered 2013-10-31 – 2013-11-02 (×4): 1 via ORAL

## 2013-10-31 MED ORDER — TUBERCULIN PPD 5 UNIT/0.1ML ID SOLN
5.0000 [IU] | Freq: Once | INTRADERMAL | Status: AC
  Administered 2013-10-31: 5 [IU] via INTRADERMAL
  Filled 2013-10-31: qty 0.1

## 2013-10-31 MED ORDER — TUBERCULIN PPD 5 UNIT/0.1ML ID SOLN
5.0000 [IU] | Freq: Once | INTRADERMAL | Status: DC
  Filled 2013-10-31: qty 0.1

## 2013-10-31 NOTE — H&P (Signed)
Shane Stone is an 28 y.o. male.   Chief Complaint: Abdominal pain HPI: Patient is a 28 year old white male with a history of Crohn's disease, status post percutaneous drainage of an intra-abdominal abscess in 2014 at [facility] who presents with worsening right sided abdominal pain. Patient has not been able to take medications for his Crohn's disease. He cannot afford them. CT scan the abdomen revealed a large intra-abdominal abscess. The patient has no primary care physician.  Past Medical History  Diagnosis Date  . Crohn's     Past Surgical History  Procedure Laterality Date  . Mouth surgery  2010  . Small intestine surgery      from infection. no intestines removed.     Family History  Problem Relation Age of Onset  . Colon cancer Neg Hx   . Crohn's disease Neg Hx   . Inflammatory bowel disease Neg Hx    Social History:  reports that he has been smoking Cigarettes.  He has a 9 pack-year smoking history. He has never used smokeless tobacco. He reports that he does not drink alcohol or use illicit drugs.  Allergies: No Known Allergies  Medications Prior to Admission  Medication Sig Dispense Refill  . ibuprofen (ADVIL,MOTRIN) 200 MG tablet Take 200 mg by mouth every 6 (six) hours as needed for headache, mild pain or moderate pain.        Results for orders placed during the hospital encounter of 10/30/13 (from the past 48 hour(s))  CBC WITH DIFFERENTIAL     Status: Abnormal   Collection Time    10/30/13  3:57 PM      Result Value Ref Range   WBC 10.0  4.0 - 10.5 K/uL   RBC 3.71 (*) 4.22 - 5.81 MIL/uL   Hemoglobin 7.2 (*) 13.0 - 17.0 g/dL   HCT 25.3 (*) 39.0 - 52.0 %   MCV 68.2 (*) 78.0 - 100.0 fL   MCH 19.4 (*) 26.0 - 34.0 pg   MCHC 28.5 (*) 30.0 - 36.0 g/dL   RDW 18.6 (*) 11.5 - 15.5 %   Platelets 574 (*) 150 - 400 K/uL   Neutrophils Relative % 76  43 - 77 %   Neutro Abs 7.7  1.7 - 7.7 K/uL   Lymphocytes Relative 15  12 - 46 %   Lymphs Abs 1.5   0.7 - 4.0 K/uL   Monocytes Relative 8  3 - 12 %   Monocytes Absolute 0.8  0.1 - 1.0 K/uL   Eosinophils Relative 0  0 - 5 %   Eosinophils Absolute 0.0  0.0 - 0.7 K/uL   Basophils Relative 0  0 - 1 %   Basophils Absolute 0.0  0.0 - 0.1 K/uL   RBC Morphology SCHISTOCYTES NOTED ON SMEAR    BASIC METABOLIC PANEL     Status: Abnormal   Collection Time    10/30/13  3:57 PM      Result Value Ref Range   Sodium 138  137 - 147 mEq/L   Potassium 3.2 (*) 3.7 - 5.3 mEq/L   Chloride 102  96 - 112 mEq/L   CO2 22  19 - 32 mEq/L   Glucose, Bld 106 (*) 70 - 99 mg/dL   BUN 24 (*) 6 - 23 mg/dL   Creatinine, Ser 1.33  0.50 - 1.35 mg/dL   Calcium 9.0  8.4 - 10.5 mg/dL   GFR calc non Af Amer 71 (*) >90 mL/min   GFR calc Af  Amer 82 (*) >90 mL/min   Comment: (NOTE)     The eGFR has been calculated using the CKD EPI equation.     This calculation has not been validated in all clinical situations.     eGFR's persistently <90 mL/min signify possible Chronic Kidney     Disease.   Anion gap 14  5 - 15  HEPATIC FUNCTION PANEL     Status: Abnormal   Collection Time    10/30/13  3:57 PM      Result Value Ref Range   Total Protein 7.6  6.0 - 8.3 g/dL   Albumin 2.4 (*) 3.5 - 5.2 g/dL   AST 11  0 - 37 U/L   ALT <5  0 - 53 U/L   Alkaline Phosphatase 82  39 - 117 U/L   Total Bilirubin 0.2 (*) 0.3 - 1.2 mg/dL   Bilirubin, Direct <0.2  0.0 - 0.3 mg/dL   Indirect Bilirubin NOT CALCULATED  0.3 - 0.9 mg/dL  LIPASE, BLOOD     Status: Abnormal   Collection Time    10/30/13  3:57 PM      Result Value Ref Range   Lipase 9 (*) 11 - 59 U/L  BASIC METABOLIC PANEL     Status: Abnormal   Collection Time    10/31/13  6:17 AM      Result Value Ref Range   Sodium 138  137 - 147 mEq/L   Potassium 3.1 (*) 3.7 - 5.3 mEq/L   Chloride 104  96 - 112 mEq/L   CO2 22  19 - 32 mEq/L   Glucose, Bld 101 (*) 70 - 99 mg/dL   BUN 16  6 - 23 mg/dL   Creatinine, Ser 1.12  0.50 - 1.35 mg/dL   Calcium 8.0 (*) 8.4 - 10.5 mg/dL   GFR  calc non Af Amer 87 (*) >90 mL/min   GFR calc Af Amer >90  >90 mL/min   Comment: (NOTE)     The eGFR has been calculated using the CKD EPI equation.     This calculation has not been validated in all clinical situations.     eGFR's persistently <90 mL/min signify possible Chronic Kidney     Disease.   Anion gap 12  5 - 15  MAGNESIUM     Status: None   Collection Time    10/31/13  6:17 AM      Result Value Ref Range   Magnesium 1.9  1.5 - 2.5 mg/dL  PHOSPHORUS     Status: None   Collection Time    10/31/13  6:17 AM      Result Value Ref Range   Phosphorus 3.7  2.3 - 4.6 mg/dL  CBC     Status: Abnormal   Collection Time    10/31/13  6:17 AM      Result Value Ref Range   WBC 5.3  4.0 - 10.5 K/uL   RBC 2.58 (*) 4.22 - 5.81 MIL/uL   Hemoglobin 5.0 (*) 13.0 - 17.0 g/dL   Comment: DELTA CHECK NOTED     RESULT REPEATED AND VERIFIED     CRITICAL RESULT CALLED TO, READ BACK BY AND VERIFIED WITH:     SHAWNNA RODGERS RN ON [id number] AT 0700 BY RESSEGGR   HCT 17.6 (*) 39.0 - 52.0 %   MCV 68.2 (*) 78.0 - 100.0 fL   MCH 19.4 (*) 26.0 - 34.0 pg   MCHC 28.4 (*) 30.0 - 36.0 g/dL  RDW 18.5 (*) 11.5 - 15.5 %   Platelets 323  150 - 400 K/uL   Comment: DELTA CHECK NOTED     RESULT REPEATED AND VERIFIED  PROTIME-INR     Status: Abnormal   Collection Time    10/31/13  6:17 AM      Result Value Ref Range   Prothrombin Time 18.7 (*) 11.6 - 15.2 seconds   INR 1.56 (*) 0.00 - 1.49  APTT     Status: Abnormal   Collection Time    10/31/13  6:17 AM      Result Value Ref Range   aPTT 47 (*) 24 - 37 seconds   Comment:            IF BASELINE aPTT IS ELEVATED,     SUGGEST PATIENT RISK ASSESSMENT     BE USED TO DETERMINE APPROPRIATE     ANTICOAGULANT THERAPY.  ABO/RH     Status: None   Collection Time    10/31/13  7:29 AM      Result Value Ref Range   ABO/RH(D) A POS    TYPE AND SCREEN     Status: None   Collection Time    10/31/13  7:34 AM      Result Value Ref Range   ABO/RH(D) A POS      Antibody Screen NEG     Sample Expiration 11/03/2013     Unit Number [id number]     Blood Component Type RED CELLS,LR     Unit division 00     Status of Unit ALLOCATED     Transfusion Status OK TO TRANSFUSE     Crossmatch Result Compatible     Unit Number [id number]     Blood Component Type RED CELLS,LR     Unit division 00     Status of Unit ALLOCATED     Transfusion Status OK TO TRANSFUSE     Crossmatch Result Compatible     Unit Number [id number]     Blood Component Type RED CELLS,LR     Unit division 00     Status of Unit ALLOCATED     Transfusion Status OK TO TRANSFUSE     Crossmatch Result Compatible     Unit Number [id number]     Blood Component Type RED CELLS,LR     Unit division 00     Status of Unit ALLOCATED     Transfusion Status OK TO TRANSFUSE     Crossmatch Result Compatible    PREPARE RBC (CROSSMATCH)     Status: None   Collection Time    10/31/13  7:34 AM      Result Value Ref Range   Order Confirmation ORDER PROCESSED BY BLOOD BANK     Ct Abdomen Pelvis W Contrast  10/30/2013   CLINICAL DATA:  History of Crohn's disease. Not eating well. Vomiting for 5 days.  EXAM: CT ABDOMEN AND PELVIS WITH CONTRAST  TECHNIQUE: Multidetector CT imaging of the abdomen and pelvis was performed using the standard protocol following bolus administration of intravenous contrast.  CONTRAST:  31m OMNIPAQUE IOHEXOL 300 MG/ML SOLN, 1026mOMNIPAQUE IOHEXOL 300 MG/ML SOLN  COMPARISON:  CT abdomen pelvis - 10/04/2012; 06/29/2010; 09/26/2012; CT-guided abdominal abscess drainage catheter placement - 09/27/2012  FINDINGS: There is a serpiginous peripherally enhancing abscess within the right lower abdomen/pelvis which measures approximately 9.5 x 3.0 x 8.7 cm (as measured in greatest oblique axial and craniocaudal dimensions respectively -axial image 63, series 2, coronal image 18,  series 4). There is fistulous extension of this abscess through the right lateral abdominal wall,  likely along the course of the prior percutaneous catheter (representative axial images 51 and 54, series 2). An exact source of this large serpiginous abscess is not definitely determined though again, the etiology is suspected to originate nearly the cecum and/or terminal ileum. Note, the appendix is no longer imaged.  Ingested enteric contrast extends to the level of the mid small bowel. There is marked abnormal ball wall thickening involving the sigmoid colon (image 76, series 2) and several loops of small bowel within the left lower abdomen/pelvis (image 65, series 2). Bowel loops within the right lower abdominal quadrant are difficult to discern secondary to lack of enteric contrast, significant amount of mesenteric fat as well as the associated inflammatory change. No definite pneumatosis or portal venous gas.  Normal hepatic contour. Normal appearance of the gallbladder. No radiopaque gallstones. There is a minimal amount of periportal edema. No definite intra or extrahepatic biliary duct dilatation. The main, right and left portal veins are widely patent. There is a trace amount of fluid adjacent to the caudal aspect of the right lobe of the liver.  There is symmetric enhancement and excretion of the bilateral kidneys. Incidental note is made of a punctate (approximately 2 mm) nonobstructing stone within the superior pole the left kidney (axial image 35, series 2). No urinary obstruction or perinephric stranding. Normal appearance of the bilateral adrenal glands and pancreas. The spleen remains enlarged, measuring approximately 16.1 cm in length (image 29, series 2). Interval increase in size of the now approximately 2.8 x 2.4 cm hypo attenuating (15 Hounsfield unit) cyst within the spleen (image 19, series 2, previously, 1.8 cm. No perisplenic stranding.  Normal caliber of the abdominal aorta. The major branch vessels of the abdominal aorta appear patent on this non CTA examination.  Worsening mesenteric  lymphadenopathy with index lymph nodes now measuring approximately 1.1 cm (image 52, series 2) and 1.6 cm (image 56), previously, 0.6 and 1.3 cm respectively, presumably reactive.  Limited visualization of the lower thorax is negative for focal airspace opacity or pleural effusion. Normal heart size. No pericardial effusion.  No acute or aggressive osseous abnormalities.  There is mild diffuse body wall anasarca appearing and  IMPRESSION: 1. Interval development of a recurrent serpiginous large (at least 9.5 cm) abscess centered within the right lower abdominal quadrant. There is fistulous connection of this abscess through the right lateral abdominal wall, along the course of the prior percutaneous drainage catheter placement. The etiology of this abscess is not depicted on this examination though presumably originates from the cecum/terminal ileum as was demonstrated on prior abdominal CT performed 09/26/2012. Note however, the appendix is no longer visualized and as such, a concomitant appendicitis is not excluded. 2. Worsening presumably reactive mesenteric lymphadenopathy. 3. Punctate (approximately 2 mm) nonobstructing stone within the superior/mid aspect of the left kidney. Critical Value/emergent results were called by telephone at the time of interpretation on 10/30/2013 at 8:40 pm to Dr. Milton Ferguson , who verbally acknowledged these results.   Electronically Signed   By: Sandi Mariscal M.D.   On: 10/30/2013 20:45    Review of Systems  Constitutional: Positive for weight loss and malaise/fatigue.  HENT: Negative.   Eyes: Negative.   Respiratory: Negative.   Cardiovascular: Negative.   Gastrointestinal: Positive for nausea and abdominal pain. Negative for diarrhea, constipation, blood in stool and melena.  Genitourinary: Negative.   Musculoskeletal: Negative.   Skin:  Negative.     Blood pressure 101/60, pulse 96, temperature 98.5 F (36.9 C), temperature source Oral, resp. rate 20, height '5\' 10"'   (1.778 m), weight 61.735 kg (136 lb 1.6 oz), SpO2 100.00%. Physical Exam  Vitals reviewed. Constitutional: He is oriented to person, place, and time.  Cachectic white male  HENT:  Head: Normocephalic and atraumatic.  Neck: Normal range of motion. Neck supple.  Cardiovascular: Normal rate, regular rhythm and normal heart sounds.   Respiratory: Effort normal and breath sounds normal.  GI: Soft. He exhibits distension. There is tenderness. There is no rebound.  Tender with fullness in the right lower quadrant to palpation.  Neurological: He is alert and oriented to person, place, and time.  Skin: Skin is warm and dry.     Assessment/Plan Impression: Crohn's disease with exacerbation/abscess. Appendix is not visualized, though this attack seems similar to his previous occurrence of Crohn's disease. His anemia secondary to chronic disease. His hemoglobin dropped do to hydration. Plan: Patient is to receive multiple blood transfusions. This has been explained to the patient, who agrees with blood transfusions. Interventional radiology will delay percutaneous drainage until after the blood is given. This most likely will happen tomorrow. He is not septic so this is fine. GI consult pending.  Nikolus Marczak A 10/31/2013, 9:53 AM

## 2013-10-31 NOTE — Progress Notes (Addendum)
Patient ID: Shane Stone, male   DOB: 07-Nov-1985, 28 y.o.   MRN: [id number]   Request has been received for possible abscess drain placement in IR at [facility] Dr Lowella Dandy has reviewed imaging Amenable to drain  Orders in chart for drain to be placed today at [facility] Rad Discussed with RN  Addendum: RN states she needs to speak to Dr Lovell Sheehan about procedure She feels it may be ON HOLD. RN to call me back asap

## 2013-10-31 NOTE — Consult Note (Signed)
Reason for Consult: Crohn's Referring Physician: Hospitalist  Shane Stone is an 28 y.o. male.  HPI: Admitted thru the ED last. For the past 4-5 days he c/o rt lower abdominal pain. Hx of Crohn's disease. Diagnosed in 2012.  Could only lie on his back due to the pain. States his abdomen is swollen.  He is not taking any medications for his Crohn's. Similar episode of Crohn's flare last year and seen at [facility]. Apparently a drain was inserted for greater than 20 days. Pain 8/10.  Will be transfused with 3 units of PRBCs today. He has had a drop in his hemoglobin from 7.2 to 5.  Denies having a fever. He has had nausea and vomiting after eating.  His last BM was yesterday after admission. He denies seeing any blood in his stool.  Stools have been soft.  Usually has one BM a day.  Colonoscopy 06/2010 Dr. Gala Romney: IMPRESSION: Normal rectum and colon, markedly abnormal terminal ileum  as described above, status post biopsy.  I suspect we are dealing with Crohn disease. Wrap of PPD.  Biopsy: Overall findings are mostly consistent with Crohn's disease.  Past Medical History  Diagnosis Date  . Crohn's     Past Surgical History  Procedure Laterality Date  . Mouth surgery  2010  . Small intestine surgery      from infection. no intestines removed.     Family History  Problem Relation Age of Onset  . Colon cancer Neg Hx   . Crohn's disease Neg Hx   . Inflammatory bowel disease Neg Hx     Social History:  reports that he has been smoking Cigarettes.  He has a 9 pack-year smoking history. He has never used smokeless tobacco. He reports that he does not drink alcohol or use illicit drugs.  Allergies: No Known Allergies  Medications: I have reviewed the patient's current medications.  Results for orders placed during the hospital encounter of 10/30/13 (from the past 48 hour(s))  CBC WITH DIFFERENTIAL     Status: Abnormal   Collection Time    10/30/13  3:57 PM      Result Value Ref  Range   WBC 10.0  4.0 - 10.5 K/uL   RBC 3.71 (*) 4.22 - 5.81 MIL/uL   Hemoglobin 7.2 (*) 13.0 - 17.0 g/dL   HCT 25.3 (*) 39.0 - 52.0 %   MCV 68.2 (*) 78.0 - 100.0 fL   MCH 19.4 (*) 26.0 - 34.0 pg   MCHC 28.5 (*) 30.0 - 36.0 g/dL   RDW 18.6 (*) 11.5 - 15.5 %   Platelets 574 (*) 150 - 400 K/uL   Neutrophils Relative % 76  43 - 77 %   Neutro Abs 7.7  1.7 - 7.7 K/uL   Lymphocytes Relative 15  12 - 46 %   Lymphs Abs 1.5  0.7 - 4.0 K/uL   Monocytes Relative 8  3 - 12 %   Monocytes Absolute 0.8  0.1 - 1.0 K/uL   Eosinophils Relative 0  0 - 5 %   Eosinophils Absolute 0.0  0.0 - 0.7 K/uL   Basophils Relative 0  0 - 1 %   Basophils Absolute 0.0  0.0 - 0.1 K/uL   RBC Morphology SCHISTOCYTES NOTED ON SMEAR    BASIC METABOLIC PANEL     Status: Abnormal   Collection Time    10/30/13  3:57 PM      Result Value Ref Range   Sodium 138  137 -  147 mEq/L   Potassium 3.2 (*) 3.7 - 5.3 mEq/L   Chloride 102  96 - 112 mEq/L   CO2 22  19 - 32 mEq/L   Glucose, Bld 106 (*) 70 - 99 mg/dL   BUN 24 (*) 6 - 23 mg/dL   Creatinine, Ser 1.33  0.50 - 1.35 mg/dL   Calcium 9.0  8.4 - 10.5 mg/dL   GFR calc non Af Amer 71 (*) >90 mL/min   GFR calc Af Amer 82 (*) >90 mL/min   Comment: (NOTE)     The eGFR has been calculated using the CKD EPI equation.     This calculation has not been validated in all clinical situations.     eGFR's persistently <90 mL/min signify possible Chronic Kidney     Disease.   Anion gap 14  5 - 15  HEPATIC FUNCTION PANEL     Status: Abnormal   Collection Time    10/30/13  3:57 PM      Result Value Ref Range   Total Protein 7.6  6.0 - 8.3 g/dL   Albumin 2.4 (*) 3.5 - 5.2 g/dL   AST 11  0 - 37 U/L   ALT <5  0 - 53 U/L   Alkaline Phosphatase 82  39 - 117 U/L   Total Bilirubin 0.2 (*) 0.3 - 1.2 mg/dL   Bilirubin, Direct <0.2  0.0 - 0.3 mg/dL   Indirect Bilirubin NOT CALCULATED  0.3 - 0.9 mg/dL  LIPASE, BLOOD     Status: Abnormal   Collection Time    10/30/13  3:57 PM      Result  Value Ref Range   Lipase 9 (*) 11 - 59 U/L  BASIC METABOLIC PANEL     Status: Abnormal   Collection Time    10/31/13  6:17 AM      Result Value Ref Range   Sodium 138  137 - 147 mEq/L   Potassium 3.1 (*) 3.7 - 5.3 mEq/L   Chloride 104  96 - 112 mEq/L   CO2 22  19 - 32 mEq/L   Glucose, Bld 101 (*) 70 - 99 mg/dL   BUN 16  6 - 23 mg/dL   Creatinine, Ser 1.12  0.50 - 1.35 mg/dL   Calcium 8.0 (*) 8.4 - 10.5 mg/dL   GFR calc non Af Amer 87 (*) >90 mL/min   GFR calc Af Amer >90  >90 mL/min   Comment: (NOTE)     The eGFR has been calculated using the CKD EPI equation.     This calculation has not been validated in all clinical situations.     eGFR's persistently <90 mL/min signify possible Chronic Kidney     Disease.   Anion gap 12  5 - 15  MAGNESIUM     Status: None   Collection Time    10/31/13  6:17 AM      Result Value Ref Range   Magnesium 1.9  1.5 - 2.5 mg/dL  PHOSPHORUS     Status: None   Collection Time    10/31/13  6:17 AM      Result Value Ref Range   Phosphorus 3.7  2.3 - 4.6 mg/dL  CBC     Status: Abnormal   Collection Time    10/31/13  6:17 AM      Result Value Ref Range   WBC 5.3  4.0 - 10.5 K/uL   RBC 2.58 (*) 4.22 - 5.81 MIL/uL   Hemoglobin 5.0 (*)  13.0 - 17.0 g/dL   Comment: DELTA CHECK NOTED     RESULT REPEATED AND VERIFIED     CRITICAL RESULT CALLED TO, READ BACK BY AND VERIFIED WITH:     SHAWNNA RODGERS RN ON [id number] AT 0700 BY RESSEGGR   HCT 17.6 (*) 39.0 - 52.0 %   MCV 68.2 (*) 78.0 - 100.0 fL   MCH 19.4 (*) 26.0 - 34.0 pg   MCHC 28.4 (*) 30.0 - 36.0 g/dL   RDW 18.5 (*) 11.5 - 15.5 %   Platelets 323  150 - 400 K/uL   Comment: DELTA CHECK NOTED     RESULT REPEATED AND VERIFIED  PROTIME-INR     Status: Abnormal   Collection Time    10/31/13  6:17 AM      Result Value Ref Range   Prothrombin Time 18.7 (*) 11.6 - 15.2 seconds   INR 1.56 (*) 0.00 - 1.49  APTT     Status: Abnormal   Collection Time    10/31/13  6:17 AM      Result Value Ref Range    aPTT 47 (*) 24 - 37 seconds   Comment:            IF BASELINE aPTT IS ELEVATED,     SUGGEST PATIENT RISK ASSESSMENT     BE USED TO DETERMINE APPROPRIATE     ANTICOAGULANT THERAPY.  PREPARE RBC (CROSSMATCH)     Status: None   Collection Time    10/31/13  7:34 AM      Result Value Ref Range   Order Confirmation ORDER PROCESSED BY BLOOD BANK      Ct Abdomen Pelvis W Contrast  10/30/2013   CLINICAL DATA:  History of Crohn's disease. Not eating well. Vomiting for 5 days.  EXAM: CT ABDOMEN AND PELVIS WITH CONTRAST  TECHNIQUE: Multidetector CT imaging of the abdomen and pelvis was performed using the standard protocol following bolus administration of intravenous contrast.  CONTRAST:  62m OMNIPAQUE IOHEXOL 300 MG/ML SOLN, 1074mOMNIPAQUE IOHEXOL 300 MG/ML SOLN  COMPARISON:  CT abdomen pelvis - 10/04/2012; 06/29/2010; 09/26/2012; CT-guided abdominal abscess drainage catheter placement - 09/27/2012  FINDINGS: There is a serpiginous peripherally enhancing abscess within the right lower abdomen/pelvis which measures approximately 9.5 x 3.0 x 8.7 cm (as measured in greatest oblique axial and craniocaudal dimensions respectively -axial image 63, series 2, coronal image 18, series 4). There is fistulous extension of this abscess through the right lateral abdominal wall, likely along the course of the prior percutaneous catheter (representative axial images 51 and 54, series 2). An exact source of this large serpiginous abscess is not definitely determined though again, the etiology is suspected to originate nearly the cecum and/or terminal ileum. Note, the appendix is no longer imaged.  Ingested enteric contrast extends to the level of the mid small bowel. There is marked abnormal ball wall thickening involving the sigmoid colon (image 76, series 2) and several loops of small bowel within the left lower abdomen/pelvis (image 65, series 2). Bowel loops within the right lower abdominal quadrant are difficult to  discern secondary to lack of enteric contrast, significant amount of mesenteric fat as well as the associated inflammatory change. No definite pneumatosis or portal venous gas.  Normal hepatic contour. Normal appearance of the gallbladder. No radiopaque gallstones. There is a minimal amount of periportal edema. No definite intra or extrahepatic biliary duct dilatation. The main, right and left portal veins are widely patent. There is a trace amount of  fluid adjacent to the caudal aspect of the right lobe of the liver.  There is symmetric enhancement and excretion of the bilateral kidneys. Incidental note is made of a punctate (approximately 2 mm) nonobstructing stone within the superior pole the left kidney (axial image 35, series 2). No urinary obstruction or perinephric stranding. Normal appearance of the bilateral adrenal glands and pancreas. The spleen remains enlarged, measuring approximately 16.1 cm in length (image 29, series 2). Interval increase in size of the now approximately 2.8 x 2.4 cm hypo attenuating (15 Hounsfield unit) cyst within the spleen (image 19, series 2, previously, 1.8 cm. No perisplenic stranding.  Normal caliber of the abdominal aorta. The major branch vessels of the abdominal aorta appear patent on this non CTA examination.  Worsening mesenteric lymphadenopathy with index lymph nodes now measuring approximately 1.1 cm (image 52, series 2) and 1.6 cm (image 56), previously, 0.6 and 1.3 cm respectively, presumably reactive.  Limited visualization of the lower thorax is negative for focal airspace opacity or pleural effusion. Normal heart size. No pericardial effusion.  No acute or aggressive osseous abnormalities.  There is mild diffuse body wall anasarca appearing and  IMPRESSION: 1. Interval development of a recurrent serpiginous large (at least 9.5 cm) abscess centered within the right lower abdominal quadrant. There is fistulous connection of this abscess through the right lateral  abdominal wall, along the course of the prior percutaneous drainage catheter placement. The etiology of this abscess is not depicted on this examination though presumably originates from the cecum/terminal ileum as was demonstrated on prior abdominal CT performed 09/26/2012. Note however, the appendix is no longer visualized and as such, a concomitant appendicitis is not excluded. 2. Worsening presumably reactive mesenteric lymphadenopathy. 3. Punctate (approximately 2 mm) nonobstructing stone within the superior/mid aspect of the left kidney. Critical Value/emergent results were called by telephone at the time of interpretation on 10/30/2013 at 8:40 pm to Dr. Milton Ferguson , who verbally acknowledged these results.   Electronically Signed   By: Sandi Mariscal M.D.   On: 10/30/2013 20:45    ROS Blood pressure 101/60, pulse 96, temperature 98.5 F (36.9 C), temperature source Oral, resp. rate 20, height _0  (1.778 m), weight 136 lb 1.6 oz (61.735 kg), SpO2 100.00%. Physical Exam Alert and oriented. Skin warm and dry. Oral mucosa is moist.   . Sclera anicteric, conjunctivae is pink. Thyroid not enlarged. No cervical lymphadenopathy. Lungs clear. Heart regular rate and rhythm.  Abdomen is soft. Bowel sounds are positive. No hepatomegaly. Abdominal swelling to rt lower quadrant. Warm to the touch.  No edema to lower extremities.    Assessment/Plan: Large abdominal abscess with know hx of Crohn's disease. Marland Kitchen Hx of abdominal abscess last year and with percutaneous drainage. Patient has not been on any medications for his Crohn's x 3 yrs.  Agree with Cipro and Flagyl.  Patient needs CT guided percutaneous drainage of abscess. I discussed with Dr. Lindi Adie W 10/31/2013, 8:13 AM    GI attending note; Patient into and examined.  Condition also reviewed with Dr. Aviva Signs Abdominopelvic CT reviewed. Briefly patient is 28 year old Caucasian male who was diagnosed with Crohn's disease involving  distal small bowel by Dr. Gala Romney in May 2012. Patient has been on prednisone sporadically. Patient did not follow his recommendations and was discharged from their practice over 3 years ago.  He was hospitalized at [facility] in [place], [place] in August last year for abdominal abscess treated with percutaneous drain. Now he presents with  very large abscess extending into right flank and inflammatory changes to skin. Dr. Arnoldo Morale has arranged for him to undergo CT-guided percutaneous drainage of this complex abscess. Patient also has profound microcytic anemia and is receiving blood transfusion today. Patient has been taking ibuprofen on regular basis which I am sure has not helped underlying inflammatory bowel disease. 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 He cannot be treated with biologic scar immunomodulators until infection has been eradicated. He may or may not need surgical intervention. Will recheck hepatitis B surface antigen during this admission along with HCV antibody and PPD.

## 2013-10-31 NOTE — Progress Notes (Signed)
Patient ID: Shane Stone, male   DOB: 1985-09-09, 28 y.o.   MRN: [id number]   Pt has ben rescheduled for abdominal abscess drain placement---to be performed now 9/10 At [facility]  Pt receiving transfusion for hg of 5 today  Orders in for 9/10 abscess drain placement To be at [facility] Rad 1000 am by ambulance Will return to [facility] after procedure

## 2013-10-31 NOTE — Progress Notes (Signed)
CRITICAL VALUE ALERT  Critical value received:  Hemoglobin 5  Date of notification: 10/31/13   Time of notification:  0700  Critical value read back: yes  Nurse who received alert:  Alvira Philips, RN  MD notified (1st page):  Dr. Lovell Sheehan  Time of first page:  0715  MD notified (2nd page):  Time of second page:  Responding MD:  Dr. Lovell Sheehan.   Time MD responded:  0715. Received order to type and cross 4 units of PRBCs and transfuse 3 units of PRBCs.

## 2013-10-31 NOTE — Progress Notes (Signed)
Upon entering end time and end VS for unit of blood: [id number], I found that the unit had been removed from the doc flowsheet.  Hyperlink to this particular unit said that it had been discontinued, although blood had not been completed.  Hyperlink shows VS prior to administration, start time of unit, and 15 minute VS check.  Unsure of how unit disappeared from flowsheet.  Unit completed infusing at 1344.  VS documented per protocol.  VS WNL and no patient distress or reaction noted.

## 2013-10-31 NOTE — Progress Notes (Signed)
INITIAL NUTRITION ASSESSMENT  DOCUMENTATION CODES Per approved criteria  -Not Applicable   INTERVENTION: -RD to follow for diet advancement -Add Resource Breeze po TID, each supplement provides 250 kcal and 9 grams of protein with diet advancement  NUTRITION DIAGNOSIS: Inadequate oral intake related to altered GI functions as evidenced by NPO.   Goal: Pt will meet >90% of estimated nutritional needs  Monitor:  Diet advancement, PO intake, labs, weight changes, I/O's  Reason for Assessment: MST=2  28 y.o. male  Admitting Dx: <principal problem not specified>  Patient is a 28 y.o. male presenting with vomiting and abdominal pain. Hx of Crohn's disease.   ASSESSMENT: Pt admitted with Crohn's disease. He has hx of noncompliance with outpatient treatment and drug seeking behavior per outpatient GI notes.  He has not been taking medications for Chron's disease due to finances. Unable to perform nutrition focused physical exam at this time. Pt was using the restroom at time of visit.  Pt to have blood tranfusion today due to Hgb of 5. Abdominal abscess drain placement is scheduled with [facility] IR for 11/01/13. Labs reviewed. K: 3.1. Glucose: 101. Mg and Phos WDL.   Height: Ht Readings from Last 1 Encounters:  10/30/13 5\' 10"  (1.778 m)    Weight: Wt Readings from Last 1 Encounters:  10/30/13 136 lb 1.6 oz (61.735 kg)    Ideal Body Weight: 166#  % Ideal Body Weight: 82%  Wt Readings from Last 10 Encounters:  10/30/13 136 lb 1.6 oz (61.735 kg)  06/10/10 138 lb (62.596 kg)    Usual Body Weight: 138#  % Usual Body Weight: 99%  BMI:  Body mass index is 19.53 kg/(m^2). Normal weight range.   Estimated Nutritional Needs: Kcal: 1700-1900 Protein: 74-84 grams Fluid: 1.7-1.9 L  Skin: WDL  Diet Order: NPO  EDUCATION NEEDS: -Education not appropriate at this time  No intake or output data in the 24 hours ending 10/31/13 0902  Last BM: 10/30/13  Labs:   Recent  Labs Lab 10/30/13 1557 10/31/13 0617  NA 138 138  K 3.2* 3.1*  CL 102 104  CO2 22 22  BUN 24* 16  CREATININE 1.33 1.12  CALCIUM 9.0 8.0*  MG  --  1.9  PHOS  --  3.7  GLUCOSE 106* 101*    CBG (last 3)  No results found for this basename: GLUCAP,  in the last 72 hours  Scheduled Meds: . sodium chloride   Intravenous Once  . ciprofloxacin  400 mg Intravenous Q12H  . metronidazole  500 mg Intravenous Q8H  . nicotine  21 mg Transdermal Daily  . nicotine  21 mg Transdermal NOW    Continuous Infusions: . dextrose 5 % and 0.45 % NaCl with KCl 40 mEq/L 100 mL/hr at 10/30/13 2253    Past Medical History  Diagnosis Date  . Crohn's     Past Surgical History  Procedure Laterality Date  . Mouth surgery  2010  . Small intestine surgery      from infection. no intestines removed.     Elleigh Cassetta A. Mayford Knife, RD, LDN Pager: [phone]

## 2013-10-31 NOTE — Progress Notes (Signed)
Gave patient TB test to right forearm. Lot #[id number] Exp 11/10/13. Bleb appeared,  Area circled. Pt tolerated fine.

## 2013-11-01 ENCOUNTER — Ambulatory Visit (HOSPITAL_COMMUNITY)
Admit: 2013-11-01 | Discharge: 2013-11-01 | Disposition: A | Discharge status: Home / Self Care | Payer: Self-pay | Attending: General Surgery | Admitting: General Surgery

## 2013-11-01 LAB — BASIC METABOLIC PANEL
Anion gap: 11 (ref 5–15)
BUN: 12 mg/dL (ref 6–23)
CO2: 25 mEq/L (ref 19–32)
Calcium: 8.6 mg/dL (ref 8.4–10.5)
Chloride: 104 mEq/L (ref 96–112)
Creatinine, Ser: 0.94 mg/dL (ref 0.50–1.35)
GFR calc Af Amer: >90 mL/min (ref >90)
GFR calc non Af Amer: >90 mL/min (ref >90)
Glucose, Bld: 90 mg/dL (ref 70–99)
Potassium: 3.9 mEq/L (ref 3.7–5.3)
Sodium: 140 mEq/L (ref 137–147)

## 2013-11-01 LAB — CBC
HCT: 28.5 % — ABNORMAL LOW (ref 39.0–52.0)
Hemoglobin: 8.7 g/dL — ABNORMAL LOW (ref 13.0–17.0)
MCH: 22.3 pg — ABNORMAL LOW (ref 26.0–34.0)
MCHC: 30.5 g/dL (ref 30.0–36.0)
MCV: 72.9 fL — ABNORMAL LOW (ref 78.0–100.0)
Platelets: 411 10*3/uL — ABNORMAL HIGH (ref 150–400)
RBC: 3.91 MIL/uL — ABNORMAL LOW (ref 4.22–5.81)
RDW: 19.8 % — ABNORMAL HIGH (ref 11.5–15.5)
WBC: 5.8 10*3/uL (ref 4.0–10.5)

## 2013-11-01 LAB — HEPATITIS B SURFACE ANTIGEN: Hepatitis B Surface Ag: NEGATIVE

## 2013-11-01 LAB — HEPATITIS C ANTIBODY: HCV Ab: NEGATIVE

## 2013-11-01 MED ORDER — LIDOCAINE HCL 1 % IJ SOLN
INTRAMUSCULAR | Status: AC
  Filled 2013-11-01: qty 20

## 2013-11-01 MED ORDER — HYDROMORPHONE HCL PF 1 MG/ML IJ SOLN
INTRAMUSCULAR | Status: DC | PRN
  Administered 2013-11-01: 1 mg via INTRAVENOUS

## 2013-11-01 MED ORDER — SODIUM CHLORIDE 0.9 % IV SOLN
Freq: Once | INTRAVENOUS | Status: AC
  Administered 2013-11-01: 1000 mL via INTRAVENOUS

## 2013-11-01 MED ORDER — KETOROLAC TROMETHAMINE 30 MG/ML IJ SOLN
INTRAMUSCULAR | Status: AC
  Filled 2013-11-01: qty 1

## 2013-11-01 MED ORDER — HYDROMORPHONE HCL PF 1 MG/ML IJ SOLN
INTRAMUSCULAR | Status: AC
  Filled 2013-11-01: qty 1

## 2013-11-01 MED ORDER — MIDAZOLAM HCL 2 MG/2ML IJ SOLN
INTRAMUSCULAR | Status: DC | PRN
  Administered 2013-11-01 (×2): 1 mg via INTRAVENOUS

## 2013-11-01 MED ORDER — MIDAZOLAM HCL 2 MG/2ML IJ SOLN
INTRAMUSCULAR | Status: DC
Start: 2013-11-01 — End: 2013-11-02
  Filled 2013-11-01: qty 4

## 2013-11-01 MED ORDER — FENTANYL CITRATE 0.05 MG/ML IJ SOLN
INTRAMUSCULAR | Status: DC
Start: 2013-11-01 — End: 2013-11-02
  Filled 2013-11-01: qty 4

## 2013-11-01 MED ORDER — ONDANSETRON HCL 4 MG/2ML IJ SOLN
4.0000 mg | Freq: Once | INTRAMUSCULAR | Status: AC
  Administered 2013-11-01: 4 mg via INTRAVENOUS
  Filled 2013-11-01: qty 2

## 2013-11-01 MED ORDER — HYDROMORPHONE HCL PF 1 MG/ML IJ SOLN
1.0000 mg | Freq: Once | INTRAMUSCULAR | Status: AC
  Administered 2013-11-01: 1 mg via INTRAVENOUS
  Filled 2013-11-01: qty 1

## 2013-11-01 MED ORDER — LIDOCAINE HCL 1 % IJ SOLN
INTRAMUSCULAR | Status: AC
  Filled 2013-11-01: qty 10

## 2013-11-01 MED ORDER — KETOROLAC TROMETHAMINE 30 MG/ML IJ SOLN
30.0000 mg | Freq: Once | INTRAMUSCULAR | Status: AC
  Administered 2013-11-01: 30 mg via INTRAVENOUS
  Filled 2013-11-01: qty 1

## 2013-11-01 MED ORDER — FENTANYL CITRATE 0.05 MG/ML IJ SOLN
INTRAMUSCULAR | Status: DC | PRN
  Administered 2013-11-01: 25 ug via INTRAVENOUS
  Administered 2013-11-01 (×2): 50 ug via INTRAVENOUS

## 2013-11-01 MED ORDER — ONDANSETRON HCL 4 MG/2ML IJ SOLN
INTRAMUSCULAR | Status: AC
  Filled 2013-11-01: qty 2

## 2013-11-01 MED ORDER — HYDROMORPHONE HCL PF 1 MG/ML IJ SOLN
1.0000 mg | Freq: Once | INTRAMUSCULAR | Status: DC
  Filled 2013-11-01: qty 1

## 2013-11-01 NOTE — Procedures (Signed)
CT guided abdominal abscess drain placement Findings: Free fluid in the abdomen, slightly smaller than on the comparison CT. New 23F pigtail drain into the right abdominal fluid collection 15cc of purulent fluid aspirated, sent for culture Drain to gravity drainage bag No complication No significant blood loss  Signed,  Margaretha Sheffield, DO

## 2013-11-01 NOTE — Progress Notes (Signed)
Pt sleeping. 

## 2013-11-01 NOTE — Progress Notes (Signed)
Subjective: Abdominal pain less today. Well controlled with Dilaudid.  Objective: Vital signs in last 24 hours: Temp:  [97.8 F (36.6 C)-99.1 F (37.3 C)] 97.8 F (36.6 C) (09/10 0610) Pulse Rate:  [72-98] 72 (09/10 0610) Resp:  [16-18] 18 (09/10 0610) BP: (91-111)/(54-86) 107/86 mmHg (09/10 0610) SpO2:  [98 %-100 %] 100 % (09/10 0610) Last BM Date: 10/31/13  Intake/Output from previous day: 09/09 0701 - 09/10 0700 In: 2550 [I.V.:1500; Blood:1050] Out: 400 [Urine:400] Intake/Output this shift: Total I/O In: -  Out: 600 [Urine:600]  General appearance: alert, cooperative and no distress Resp: clear to auscultation bilaterally Cardio: regular rate and rhythm, S1, S2 normal, no murmur, click, rub or gallop GI: Soft with tenderness and fullness in the right lower aspect of the abdomen. No rigidity noted.  Lab Results:   Recent Labs  10/31/13 0617 10/31/13 1953 11/01/13 0617  WBC 5.3  --  5.8  HGB 5.0* 8.8* 8.7*  HCT 17.6* 28.7* 28.5*  PLT 323  --  411*   BMET  Recent Labs  10/31/13 0617 11/01/13 0617  NA 138 140  K 3.1* 3.9  CL 104 104  CO2 22 25  GLUCOSE 101* 90  BUN 16 12  CREATININE 1.12 0.94  CALCIUM 8.0* 8.6   PT/INR  Recent Labs  10/31/13 0617  LABPROT 18.7*  INR 1.56*    Studies/Results: Ct Abdomen Pelvis W Contrast  10/30/2013   CLINICAL DATA:  History of Crohn's disease. Not eating well. Vomiting for 5 days.  EXAM: CT ABDOMEN AND PELVIS WITH CONTRAST  TECHNIQUE: Multidetector CT imaging of the abdomen and pelvis was performed using the standard protocol following bolus administration of intravenous contrast.  CONTRAST:  50mL OMNIPAQUE IOHEXOL 300 MG/ML SOLN, OMNIPAQUE IOHEXOL 300 MG/ML SOLN  COMPARISON:  CT abdomen pelvis - 10/04/2012; 06/29/2010; 09/26/2012; CT-guided abdominal abscess drainage catheter placement - 09/27/2012  FINDINGS: There is a serpiginous peripherally enhancing abscess within the right lower abdomen/pelvis which  measures approximately 9.5 x 3.0 x 8.7 cm (as measured in greatest oblique axial and craniocaudal dimensions respectively -axial image 63, series 2, coronal image 18, series 4). There is fistulous extension of this abscess through the right lateral abdominal wall, likely along the course of the prior percutaneous catheter (representative axial images 51 and 54, series 2). An exact source of this large serpiginous abscess is not definitely determined though again, the etiology is suspected to originate nearly the cecum and/or terminal ileum. Note, the appendix is no longer imaged.  Ingested enteric contrast extends to the level of the mid small bowel. There is marked abnormal ball wall thickening involving the sigmoid colon (image 76, series 2) and several loops of small bowel within the left lower abdomen/pelvis (image 65, series 2). Bowel loops within the right lower abdominal quadrant are difficult to discern secondary to lack of enteric contrast, significant amount of mesenteric fat as well as the associated inflammatory change. No definite pneumatosis or portal venous gas.  Normal hepatic contour. Normal appearance of the gallbladder. No radiopaque gallstones. There is a minimal amount of periportal edema. No definite intra or extrahepatic biliary duct dilatation. The main, right and left portal veins are widely patent. There is a trace amount of fluid adjacent to the caudal aspect of the right lobe of the liver.  There is symmetric enhancement and excretion of the bilateral kidneys. Incidental note is made of a punctate (approximately 2 mm) nonobstructing stone within the superior pole the left kidney (axial image 35, series  2). No urinary obstruction or perinephric stranding. Normal appearance of the bilateral adrenal glands and pancreas. The spleen remains enlarged, measuring approximately 16.1 cm in length (image 29, series 2). Interval increase in size of the now approximately 2.8 x 2.4 cm hypo attenuating  (15 Hounsfield unit) cyst within the spleen (image 19, series 2, previously, 1.8 cm. No perisplenic stranding.  Normal caliber of the abdominal aorta. The major branch vessels of the abdominal aorta appear patent on this non CTA examination.  Worsening mesenteric lymphadenopathy with index lymph nodes now measuring approximately 1.1 cm (image 52, series 2) and 1.6 cm (image 56), previously, 0.6 and 1.3 cm respectively, presumably reactive.  Limited visualization of the lower thorax is negative for focal airspace opacity or pleural effusion. Normal heart size. No pericardial effusion.  No acute or aggressive osseous abnormalities.  There is mild diffuse body wall anasarca appearing and  IMPRESSION: 1. Interval development of a recurrent serpiginous large (at least 9.5 cm) abscess centered within the right lower abdominal quadrant. There is fistulous connection of this abscess through the right lateral abdominal wall, along the course of the prior percutaneous drainage catheter placement. The etiology of this abscess is not depicted on this examination though presumably originates from the cecum/terminal ileum as was demonstrated on prior abdominal CT performed 09/26/2012. Note however, the appendix is no longer visualized and as such, a concomitant appendicitis is not excluded. 2. Worsening presumably reactive mesenteric lymphadenopathy. 3. Punctate (approximately 2 mm) nonobstructing stone within the superior/mid aspect of the left kidney. Critical Value/emergent results were called by telephone at the time of interpretation on 10/30/2013 at 8:40 pm to Dr. Bethann Berkshire , who verbally acknowledged these results.   Electronically Signed   By: Simonne Come M.D.   On: 10/30/2013 20:45    Anti-infectives: Anti-infectives   Start     Dose/Rate Route Frequency Ordered Stop   10/31/13 0000  metroNIDAZOLE (FLAGYL) IVPB 500 mg     500 mg 100 mL/hr over 60 Minutes Intravenous Every 8 hours 10/30/13 2245     10/30/13  2300  ciprofloxacin (CIPRO) IVPB 400 mg     400 mg 200 mL/hr over 60 Minutes Intravenous Every 12 hours 10/30/13 2245        Assessment/Plan: Impression: Crohn's disease with intra-abdominal abscess. Anemia of chronic disease, status post 3 units packed red blood cells transfused. Plan: To have interventional radiology percutaneously drain the abscess today. Appreciate input from Dr. Karilyn Cota.  LOS: 2 days    Myrl Bynum A 11/01/2013

## 2013-11-01 NOTE — Sedation Documentation (Signed)
Spoke with Dr Fredia Sorrow about pt's pain,  Dilaudid 1 mg ordered.  Awaiting [facility].  Dispatch called again for ETA.  Pt aware.

## 2013-11-01 NOTE — Sedation Documentation (Signed)
To nurses station.

## 2013-11-01 NOTE — Progress Notes (Signed)
Received report from Bonita Quin, RN at [facility]. [facility] @ 1750. Patient was alert and oriented. Patients only complaint was pain. Patient received pain medication @ 1813. Patients drain was in place and was clean, dry and intact. Bed was at lowest level, call bell was within reach, and phone was at bedside. Will continue to monitor patient.

## 2013-11-01 NOTE — Sedation Documentation (Signed)
Report given to Troy Sine RN. Patient tolerating ginger ale. Transport was called at 1450 to take patient back to [facility]. Report given to nurse Ladona Ridgel at [facility].

## 2013-11-01 NOTE — Consult Note (Signed)
Agree.  For CT guided abscess drainage today.

## 2013-11-01 NOTE — Consult Note (Signed)
Reason for Consult: RLQ abscess; Chron's disease  Chief Complaint: N/V; abd pain; wt loss Chief Complaint  Patient presents with  . Emesis  . Abdominal Pain    Referring Physician(s): Franky Macho MD  History of Present Illness:  ROSALIO Stone is a 28 y.o. male  Pt with Hx Chron's disease Has had abdominal abscess 2012- requiring drain placement in IR at [facility] Admitted through ED few days ago at [facility] with N/V/D; abd pain; wt loss CT reveals RLQ abscess(es) Scheduled now for abscess drain placement- possibly 2 drains  Past Medical History  Diagnosis Date  . Crohn's     Past Surgical History  Procedure Laterality Date  . Mouth surgery  2010  . Small intestine surgery      from infection. no intestines removed.     Allergies: Review of patient's allergies indicates no known allergies.  Medications: Prior to Admission medications   Medication Sig Start Date End Date Taking? Authorizing Provider  ibuprofen (ADVIL,MOTRIN) 200 MG tablet Take 200 mg by mouth every 6 (six) hours as needed for headache, mild pain or moderate pain.   Yes Historical Provider, MD    Family History  Problem Relation Age of Onset  . Colon cancer Neg Hx   . Crohn's disease Neg Hx   . Inflammatory bowel disease Neg Hx     History   Social History  . Marital Status: Single    Spouse Name: N/A    Number of Children: N/A  . Years of Education: N/A   Social History Main Topics  . Smoking status: Current Every Day Smoker -- 1.00 packs/day for 9 years    Types: Cigarettes  . Smokeless tobacco: Never Used  . Alcohol Use: No  . Drug Use: No  . Sexual Activity: Yes    Partners: Female    Birth Control/ Protection: Condom     Comment: Ex-girlfriend   Other Topics Concern  . None   Social History Narrative  . None     Review of Systems: A 12 point ROS discussed and pertinent positives are indicated in the HPI above.  All other systems are negative.  Review of  Systems  Constitutional: Positive for fever, diaphoresis, activity change, appetite change, fatigue and unexpected weight change.  HENT: Positive for mouth sores.   Respiratory: Negative for apnea, chest tightness and shortness of breath.   Cardiovascular: Negative for chest pain.  Gastrointestinal: Positive for nausea, vomiting, abdominal pain, diarrhea and abdominal distention. Negative for blood in stool.  Genitourinary: Negative for flank pain and difficulty urinating.  Skin:       pale    Vital Signs: BP 107/86  Pulse 72  Temp(Src) 97.8 F (36.6 C) (Oral)  Resp 18  Ht  (1.778 m)  Wt 61.735 kg (136 lb 1.6 oz)  BMI 19.53 kg/m2  SpO2 100%  Physical Exam  Constitutional: He appears well-developed.  Ill appearing  Cardiovascular: Normal rate and normal heart sounds.   No murmur heard. Pulmonary/Chest: Effort normal. No respiratory distress. He has no wheezes.  Abdominal: He exhibits distension. There is tenderness.  Musculoskeletal: He exhibits no tenderness.  Skin: Skin is warm. There is pallor.  Psychiatric: He has a normal mood and affect. His behavior is normal. Judgment and thought content normal.    Imaging: Ct Abdomen Pelvis W Contrast  10/30/2013   CLINICAL DATA:  History of Crohn's disease. Not eating well. Vomiting for 5 days.  EXAM: CT ABDOMEN AND PELVIS  WITH CONTRAST  TECHNIQUE: Multidetector CT imaging of the abdomen and pelvis was performed using the standard protocol following bolus administration of intravenous contrast.  CONTRAST:  50mL OMNIPAQUE IOHEXOL 300 MG/ML SOLN, OMNIPAQUE IOHEXOL 300 MG/ML SOLN  COMPARISON:  CT abdomen pelvis - 10/04/2012; 06/29/2010; 09/26/2012; CT-guided abdominal abscess drainage catheter placement - 09/27/2012  FINDINGS: There is a serpiginous peripherally enhancing abscess within the right lower abdomen/pelvis which measures approximately 9.5 x 3.0 x 8.7 cm (as measured in greatest oblique axial and craniocaudal dimensions  respectively -axial image 63, series 2, coronal image 18, series 4). There is fistulous extension of this abscess through the right lateral abdominal wall, likely along the course of the prior percutaneous catheter (representative axial images 51 and 54, series 2). An exact source of this large serpiginous abscess is not definitely determined though again, the etiology is suspected to originate nearly the cecum and/or terminal ileum. Note, the appendix is no longer imaged.  Ingested enteric contrast extends to the level of the mid small bowel. There is marked abnormal ball wall thickening involving the sigmoid colon (image 76, series 2) and several loops of small bowel within the left lower abdomen/pelvis (image 65, series 2). Bowel loops within the right lower abdominal quadrant are difficult to discern secondary to lack of enteric contrast, significant amount of mesenteric fat as well as the associated inflammatory change. No definite pneumatosis or portal venous gas.  Normal hepatic contour. Normal appearance of the gallbladder. No radiopaque gallstones. There is a minimal amount of periportal edema. No definite intra or extrahepatic biliary duct dilatation. The main, right and left portal veins are widely patent. There is a trace amount of fluid adjacent to the caudal aspect of the right lobe of the liver.  There is symmetric enhancement and excretion of the bilateral kidneys. Incidental note is made of a punctate (approximately 2 mm) nonobstructing Stone within the superior pole the left kidney (axial image 35, series 2). No urinary obstruction or perinephric stranding. Normal appearance of the bilateral adrenal glands and pancreas. The spleen remains enlarged, measuring approximately 16.1 cm in length (image 29, series 2). Interval increase in size of the now approximately 2.8 x 2.4 cm hypo attenuating (15 Hounsfield unit) cyst within the spleen (image 19, series 2, previously, 1.8 cm. No perisplenic  stranding.  Normal caliber of the abdominal aorta. The major branch vessels of the abdominal aorta appear patent on this non CTA examination.  Worsening mesenteric lymphadenopathy with index lymph nodes now measuring approximately 1.1 cm (image 52, series 2) and 1.6 cm (image 56), previously, 0.6 and 1.3 cm respectively, presumably reactive.  Limited visualization of the lower thorax is negative for focal airspace opacity or pleural effusion. Normal heart size. No pericardial effusion.  No acute or aggressive osseous abnormalities.  There is mild diffuse body wall anasarca appearing and  IMPRESSION: 1. Interval development of a recurrent serpiginous large (at least 9.5 cm) abscess centered within the right lower abdominal quadrant. There is fistulous connection of this abscess through the right lateral abdominal wall, along the course of the prior percutaneous drainage catheter placement. The etiology of this abscess is not depicted on this examination though presumably originates from the cecum/terminal ileum as was demonstrated on prior abdominal CT performed 09/26/2012. Note however, the appendix is no longer visualized and as such, a concomitant appendicitis is not excluded. 2. Worsening presumably reactive mesenteric lymphadenopathy. 3. Punctate (approximately 2 mm) nonobstructing Stone within the superior/mid aspect of the left kidney. Critical Value/emergent  results were called by telephone at the time of interpretation on 10/30/2013 at 8:40 pm to Dr. Bethann Berkshire , who verbally acknowledged these results.   Electronically Signed   By: Simonne Come M.D.   On: 10/30/2013 20:45    Labs: Lab Results  Component Value Date   WBC 5.8 11/01/2013   HCT 28.5* 11/01/2013   MCV 72.9* 11/01/2013   PLT 411* 11/01/2013   NA 140 11/01/2013   K 3.9 11/01/2013   CL 104 11/01/2013   CO2 25 11/01/2013   GLUCOSE 90 11/01/2013   BUN 12 11/01/2013   CREATININE 0.94 11/01/2013   CALCIUM 8.6 11/01/2013   PROT 7.6 10/30/2013    ALBUMIN 2.4* 10/30/2013   AST 11 10/30/2013   ALT <5 10/30/2013   ALKPHOS 82 10/30/2013   BILITOT 0.2* 10/30/2013   GFRNONAA >90 11/01/2013   GFRAA >90 11/01/2013   INR 1.56* 10/31/2013    Assessment and Plan: Pt with hx Chron's disease New onset N/V/D and abd pain CT reveals abdominal abscess Request for abd abscess drain placement Pt aware of procedure benefits and risks and agreeable to proceed Consent signed and in chart    I spent a total of 20 minutes face to face in clinical consultation, greater than 50% of which was counseling/coordinating care for abdominal abscess drain placement.  Thank you for this interesting consult.  I greatly enjoyed meeting Shane Stone and look forward to participating in their care.    Robet Leu [facility]- IR [phone]

## 2013-11-01 NOTE — Progress Notes (Signed)
Pt stated pain and nausea decreased but not completely gone following Zofran and dilaudid IV.  Lights dimmed for comfort. HOB lowered.

## 2013-11-02 DIAGNOSIS — K5000 Crohn's disease of small intestine without complications: Secondary | ICD-10-CM

## 2013-11-02 DIAGNOSIS — R1031 Right lower quadrant pain: Secondary | ICD-10-CM

## 2013-11-02 DIAGNOSIS — K630 Abscess of intestine: Secondary | ICD-10-CM

## 2013-11-02 LAB — CBC
HCT: 29.7 % — ABNORMAL LOW (ref 39.0–52.0)
Hemoglobin: 8.9 g/dL — ABNORMAL LOW (ref 13.0–17.0)
MCH: 22 pg — ABNORMAL LOW (ref 26.0–34.0)
MCHC: 30 g/dL (ref 30.0–36.0)
MCV: 73.3 fL — ABNORMAL LOW (ref 78.0–100.0)
Platelets: 417 10*3/uL — ABNORMAL HIGH (ref 150–400)
RBC: 4.05 MIL/uL — ABNORMAL LOW (ref 4.22–5.81)
RDW: 20.1 % — ABNORMAL HIGH (ref 11.5–15.5)
WBC: 5.4 10*3/uL (ref 4.0–10.5)

## 2013-11-02 NOTE — Progress Notes (Signed)
Subjective: Still with some abdominal pain, though improved since catheter placement  Objective: Vital signs in last 24 hours: Temp:  [97.8 F (36.6 C)-98.5 F (36.9 C)] 97.8 F (36.6 C) (09/11 0551) Pulse Rate:  [48-66] 48 (09/11 0551) Resp:  [11-18] 14 (09/11 0551) BP: (80-107)/(44-72) 100/65 mmHg (09/11 0551) SpO2:  [95 %-100 %] 98 % (09/11 0551) Last BM Date: 10/31/13  Intake/Output from previous day: 09/10 0701 - 09/11 0700 In: 5  Out: 1030 [Urine:1000; Drains:15] Intake/Output this shift:    General appearance: alert, cooperative and no distress Resp: clear to auscultation bilaterally Cardio: regular rate and rhythm, S1, S2 normal, no murmur, click, rub or gallop GI: Soft, less distended. No rigidity noted. Tender around right side of abdomen.  Lab Results:   Recent Labs  11/01/13 0617 11/02/13 0531  WBC 5.8 5.4  HGB 8.7* 8.9*  HCT 28.5* 29.7*  PLT 411* 417*   BMET  Recent Labs  10/31/13 0617 11/01/13 0617  NA 138 140  K 3.1* 3.9  CL 104 104  CO2 22 25  GLUCOSE 101* 90  BUN 16 12  CREATININE 1.12 0.94  CALCIUM 8.0* 8.6   PT/INR  Recent Labs  10/31/13 0617  LABPROT 18.7*  INR 1.56*    Studies/Results: Ct Image Guided Drainage By Percutaneous Catheter  11/01/2013   CLINICAL DATA:  28 year old male with a history of Crohn's disease. Previous CT demonstrates intra-abdominal abscess formation. Currently complaining of fevers and night sweats.  EXAM: CT GUIDED DRAINAGE OF abdominal ABSCESS  ANESTHESIA/SEDATION: 2 mg IV Versed 125 mcg IV Fentanyl  Total Moderate Sedation Time:  26 minutes  PROCEDURE: The procedure, risks, benefits, and alternatives were explained to the patient. Questions regarding the procedure were encouraged and answered. The patient understands and consents to the procedure.  Patient brought to the CT suite was placed in supine position on CT gantry table. A scout CT of the abdomen pelvis was performed for surgical planning  purposes. Once we determine a safe approach towards the right-sided abdominal abscess at the level of the iliac crest, a fiducial marker was placed on the skin for targeting purposes and a localized CT image was acquired.  The operative field was prepped with Betadine/chlorhexidinein a sterile fashion, and a sterile drape was applied covering the operative field. A sterile gown and sterile gloves were used for the procedure. Local anesthesia was provided with 1% Lidocaine.  The skin and subcutaneous tissues in a region of interest for generously infiltrated with 2% lidocaine for local anesthesia. A small stab incision was made with an 11 blade scalpel, and a 10 cm trocar needle was advanced under CT guidance into a right-sided abdominal fluid collection. Once we confirmed position with CT guidance, the stylette was removed from the trocar needle and a small amount of purulent fluid was aspirated to confirm position. This sample sent for culture.  An 0.035 short Amplatz wire was advanced to the trocar needle into the peritoneal fluid collection. Again, this was confirmed with the CT image. The needle was removed from the wire and serial dilation with 10 [place] and 12 [place] dilators over the wire was performed. Finally a 12 French pigtail catheter was advanced over the wire into position. The wire and inner stiffener were removed, and again purulent fluid was aspirated to drain to confirmed position.  A final image was stored and a total of 15 cc of purulent fluid was aspirated. The drain was sutured in position and attached to gravity drainage.  The patient tolerated procedure well and remained hemodynamically stable throughout.  COMPLICATIONS: None  FINDINGS: Initial CT images the abdomen demonstrates residual fluid collection within the right side of the abdomen, consistent with comparison CT dated 10/30/2013. There does appear to be slightly decreased amount of fluid. The drain was placed given that the patient  is complaining of night sweats and ongoing fevers, with a concern for potential clinical worsening if the abscess were not drained.  Images during the case demonstrates a placement trocar needle into right-sided fluid collection and then modified Seldinger technique for placement of a 12 French pigtail catheter drain.  15 cc of purulent fluid was aspirated and sent for culture.  Final image demonstrates pigtail catheter within the right peritoneal cavity.  IMPRESSION: Status post placement of 12 French pigtail catheter into right-sided peritoneal abscess, with fluid sent to the lab for complete analysis.  Signed,  Yvone Neu. Loreta Ave, DO  [facility]  [facility]   Electronically Signed   By: Gilmer Mor O.D.   On: 11/01/2013 16:29    Anti-infectives: Anti-infectives   Start     Dose/Rate Route Frequency Ordered Stop   10/31/13 0000  metroNIDAZOLE (FLAGYL) IVPB 500 mg     500 mg 100 mL/hr over 60 Minutes Intravenous Every 8 hours 10/30/13 2245     10/30/13 2300  ciprofloxacin (CIPRO) IVPB 400 mg     400 mg 200 mL/hr over 60 Minutes Intravenous Every 12 hours 10/30/13 2245        Assessment/Plan: Impression: Stable, status post successful placement of pigtail catheter into intra-abdominal abscess, acute exacerbation of Crohn's disease Plan: Will be advanced to regular diet. Will get patient set up with home health for further management of the catheter as it will be staying in for 7-10 days.  LOS: 3 days    Becki Mccaskill A 11/02/2013

## 2013-11-02 NOTE — Progress Notes (Addendum)
  Subjective:  Patient continues to complain of throbbing abdominal pain which is centered in right midabdomen and flank area. His appetite is fair. He denies nausea or vomiting melena or rectal bleeding.   Objective: Blood pressure 108/84, pulse 74, temperature 98.2 F (36.8 C), temperature source Oral, resp. rate 14, height 5\' 10"  (1.778 m), weight 136 lb 1.6 oz (61.735 kg), SpO2 96.00%. Patient does not appear as pale as he did two days ago. Abdomen. Skin and right lumbar region is erythematous and a small fluctuant area. Slightly tender. Percutaneous drain in place in purulent material in the bag. No LE edema or clubbing noted.  Labs/studies Results:   Recent Labs  10/31/13 0617 10/31/13 1953 11/01/13 0617 11/02/13 0531  WBC 5.3  --  5.8 5.4  HGB 5.0* 8.8* 8.7* 8.9*  HCT 17.6* 28.7* 28.5* 29.7*  PLT 323  --  411* 417*    BMET   Recent Labs  10/31/13 0617 11/01/13 0617  NA 138 140  K 3.1* 3.9  CL 104 104  CO2 22 25  GLUCOSE 101* 90  BUN 16 12  CREATININE 1.12 0.94  CALCIUM 8.0* 8.6      Recent Labs  10/31/13 0617  LABPROT 18.7*  INR 1.56*    Hepatitis Panel   Recent Labs  11/01/13 0617  HEPBSAG NEGATIVE  HCVAB NEGATIVE    PPD has been done.  Assessment:  #1. Small bowel Crohn's disease complicated by large abdominal abscess. Status post percutaneous drain yesterday. Patient is requiring pain medication every 2 hours. He would need to be transitioned to by mouth medication at some point. Hepatitis B surface antigen and hepatitis C virus antibodies are negative. PPD also appears to be negative. #2. Iron deficiency anemia secondary to chronic GI blood loss. No evidence of acute GI bleed. Patient has received 3 units of PRBCs and H&H is stable.   Recommendations;  Patient advised to take [place] on 2 tablets daily when he is discharged. Treatment for Crohn's disease will be delayed until infection/abscess has resolved.

## 2013-11-02 NOTE — Care Management Note (Signed)
    Page 1 of 1   11/02/2013     5:22:05 PM CARE MANAGEMENT NOTE 11/02/2013  Patient:  Shane Stone, Shane Stone   Account Number:  [id number]  Date Initiated:  11/02/2013  Documentation initiated by:  Anibal Henderson  Subjective/Objective Assessment:   Admitted with crohns, with abscess, and sent to [facility] for drain placement. Pt is from home, is independent, and will return home at D/C. He lives with family.     Action/Plan:   pt is not workinmg at present, and is self pay. Information given about the [facility] discount program and he says a Artist has called him and will call again when he is home. CM gave him the [facility] #  to return their call in case he   Anticipated DC Date:  11/03/2013   Anticipated DC Plan:  HOME/SELF CARE      DC Planning Services  CM consult  MATCH Program      Choice offered to / List presented to:             Status of service:  Completed, signed off Medicare Important Message given?   (If response is "NO", the following Medicare IM given date fields will be blank) Date Medicare IM given:   Medicare IM given by:   Date Additional Medicare IM given:   Additional Medicare IM given by:    Discharge Disposition:  HOME/SELF CARE  Per UR Regulation:  Reviewed for med. necessity/level of care/duration of stay  If discussed at Long Length of Stay Meetings, dates discussed:    Comments:  11/02/13 1700 Anibal Henderson RN/CM  In case he needs this. Pt will be unable to afford any expensive medications, but can handle 2-3 $3-10 meds with family assist. Match voucher given to use if needed. He says he is comfortable with the wound/drain care as he has done this before and doen not need HH

## 2013-11-02 NOTE — Progress Notes (Signed)
TB skin test is negative.

## 2013-11-03 MED ORDER — OXYCODONE-ACETAMINOPHEN 7.5-325 MG PO TABS: 1.0000 | ORAL_TABLET | ORAL | Status: DC | PRN

## 2013-11-03 MED ORDER — CIPROFLOXACIN HCL 500 MG PO TABS: 500.0000 mg | ORAL_TABLET | Freq: Two times a day (BID) | ORAL | Status: DC

## 2013-11-03 MED ORDER — METRONIDAZOLE 500 MG PO TABS: 500.0000 mg | ORAL_TABLET | Freq: Two times a day (BID) | ORAL | Status: DC

## 2013-11-03 NOTE — Plan of Care (Signed)
Problem: Phase III Progression Outcomes Goal: Pain controlled on oral analgesia Outcome: Not Progressing Patient educated on importance of switching to oral pain meds from IV. States pain is still intolerable without IV Dilaudid.

## 2013-11-03 NOTE — Progress Notes (Signed)
NURSING PROGRESS NOTE  Shane Stone [id number] Discharge Data: 11/03/2013 2:44 PM Attending Provider: No att. providers found PCP:No PCP Per Patient   Shane Stone to be D/C'd Home per MD order.    All IV's will be discontinued and monitored for bleeding.  Surgical drain will remain in place until patient sees Dr. Lovell Sheehan for follow up appointment. Patient educated on care for surgical drain including dressing changes and daily flushes. Patient offered assistance of home health but states that he feels comfortable caring for the drain as he has had one before.  AVS summary reviewed with patient including prescriptions.   All belongings will be returned to patient for patient to take home.  Patient left floor via ambulation, escorted by NT.  Last Documented Vital Signs:  Blood pressure 89/62, pulse 60, temperature 98 F (36.7 C), temperature source Oral, resp. rate 18, height 5\' 10"  (1.778 m), weight 61.735 kg (136 lb 1.6 oz), SpO2 98.00%.  Mertha Baars D

## 2013-11-03 NOTE — Plan of Care (Signed)
Problem: Phase III Progression Outcomes Goal: Pain controlled on oral analgesia Outcome: Completed/Met Date Met:  11/03/13 Patient discharged on oral pain medication.  Problem: Discharge Progression Outcomes Goal: Tubes and drains discontinued if indicated Outcome: Completed/Met Date Met:  11/03/13 Drain will remain in patient until follow up appointment.

## 2013-11-03 NOTE — Discharge Instructions (Signed)
Irrigate catheter with 15cc normal saline daily.    Crohn Disease Crohn disease is a long-term (chronic) soreness and redness (inflammation) of the intestines (bowel). It can affect any portion of the digestive tract, from the mouth to the anus. It can also cause problems outside the digestive tract. Crohn disease is closely related to a disease called ulcerative colitis (together, these two diseases are called inflammatory bowel disease).  CAUSES  The cause of Crohn disease is not known. One Nelva Bush is that, in an easily affected person, the immune system is triggered to attack the body's own digestive tissue. Crohn disease runs in families. It seems to be more common in certain geographic areas and amongst certain races. There are no clear-cut dietary causes.  SYMPTOMS  Crohn disease can cause many different symptoms since it can affect many different parts of the body. Symptoms include:  Fatigue.  Weight loss.  Chronic diarrhea, sometime bloody.  Abdominal pain and cramps.  Fever.  Ulcers or canker sores in the mouth or rectum.  Anemia (low red blood cells).  Arthritis, skin problems, and eye problems may occur. Complications of Crohn disease can include:  Series of holes (perforation) of the bowel.  Portions of the intestines sticking to each other (adhesions).  Obstruction of the bowel.  Fistula formation, typically in the rectal area but also in other areas. A fistula is an opening between the bowels and the outside, or between the bowels and another organ.  A painful crack in the mucous membrane of the anus (rectal fissure). DIAGNOSIS  Your caregiver may suspect Crohn disease based on your symptoms and an exam. Blood tests may confirm that there is a problem. You may be asked to submit a stool specimen for examination. X-rays and CT scans may be necessary. Ultimately, the diagnosis is usually made after a procedure that uses a flexible tube that is inserted via your  mouth or your anus. This is done under sedation and is called either an upper endoscopy or colonoscopy. With these tests, the specialist can take tiny tissue samples and remove them from the inside of the bowel (biopsy). Examination of this biopsy tissue under a microscope can reveal Crohn disease as the cause of your symptoms. Due to the many different forms that Crohn disease can take, symptoms may be present for several years before a diagnosis is made. TREATMENT  Medications are often used to decrease inflammation and control the immune system. These include medicines related to aspirin, steroid medications, and newer and stronger medications to slow down the immune system. Some medications may be used as suppositories or enemas. A number of other medications are used or have been studied. Your caregiver will make specific recommendations. HOME CARE INSTRUCTIONS   Symptoms such as diarrhea can be controlled with medications. Avoid foods that have a laxative effect such as fresh fruit, vegetables, and dairy products. During flare-ups, you can rest your bowel by refraining from solid foods. Drink clear liquids frequently during the day. (Electrolyte or rehydrating fluids are best. Your caregiver can help you with suggestions.) Drink often to prevent loss of body fluids (dehydration). When diarrhea has cleared, eat small meals and more frequently. Avoid food additives and stimulants such as caffeine (coffee, tea, or chocolate). Enzyme supplements may help if you develop intolerance to a sugar in dairy products (lactose). Ask your caregiver or dietitian about specific dietary instructions.  Try to maintain a positive attitude. Learn relaxation techniques such as self-hypnosis, mental imaging, and muscle relaxation.  If  possible, avoid stresses which can aggravate your condition.  Exercise regularly.  Follow your diet.  Always get plenty of rest. SEEK MEDICAL CARE IF:   Your symptoms fail to  improve after a week or two of new treatment.  You experience continued weight loss.  You have ongoing cramps or loose bowels.  You develop a new skin rash, skin sores, or eye problems. SEEK IMMEDIATE MEDICAL CARE IF:   You have worsening of your symptoms or develop new symptoms.  You have a fever.  You develop bloody diarrhea.  You develop severe abdominal pain. MAKE SURE YOU:   Understand these instructions.  Will watch your condition.  Will get help right away if you are not doing well or get worse. Document Released: 11/18/2004 Document Revised: 06/25/2013 Document Reviewed: 10/17/2006 [facility] Patient Information 2015 [place], [place]. This information is not intended to replace advice given to you by your health care provider. Make sure you discuss any questions you have with your health care provider.

## 2013-11-03 NOTE — Discharge Summary (Signed)
Physician Discharge Summary  Patient ID: Shane Stone MRN: [id number] DOB/AGE: [birth year] 28 y.o.  Admit date: 10/30/2013 Discharge date: 11/03/2013  Admission Diagnoses: Crohn's disease, acute exacerbation with intra-abdominal abscess, anemia of chronic disease  Discharge Diagnoses: Same Active Problems:   Crohn's disease of ileum with abscess   Discharged Condition: good  Hospital Course: Patient-year-old white male with a known history of Crohn's disease, noncompliant with treatment, who presented with worsening lower abdominal pain. CT scan the abdomen revealed an intra-abdominal abscess consistent with acute exacerbation of his Crohn's disease. He also was severely anemic secondary to chronic disease. He received 3 units of packed red blood cells. His hemoglobin responded appropriately. He had no evidence of active bleeding. He underwent CT-guided drainage of the intra-abdominal abscess. Successful placement of the drain was performed. The patient is being discharged home on 11/03/2013 in good improving condition with the drain in place. Home health has been consulted.  Treatments: CT guided drainage of intra-abdominal abscess with pigtail catheter placement  Discharge Exam: Blood pressure 89/62, pulse 60, temperature 98 F (36.7 C), temperature source Oral, resp. rate 18, height 5\' 10"  (1.778 m), weight 61.735 kg (136 lb 1.6 oz), SpO2 98.00%. General appearance: alert, cooperative and no distress Resp: clear to auscultation bilaterally Cardio: regular rate and rhythm, S1, S2 normal, no murmur, click, rub or gallop GI: Soft with tenderness noted around the drainage exit site. No rigidity noted.  Disposition: 01-Home or Self Care     Medication List         ciprofloxacin 500 MG tablet  Commonly known as:  CIPRO  Take 1 tablet (500 mg total) by mouth 2 (two) times daily.     ibuprofen 200 MG tablet  Commonly known as:  ADVIL,MOTRIN  Take 200 mg by mouth every 6 (six) hours  as needed for headache, mild pain or moderate pain.     metroNIDAZOLE 500 MG tablet  Commonly known as:  FLAGYL  Take 1 tablet (500 mg total) by mouth 2 (two) times daily.     oxyCODONE-acetaminophen 7.5-325 MG per tablet  Commonly known as:  PERCOCET  Take 1-2 tablets by mouth every 4 (four) hours as needed.           Follow-up Information   Follow up with Dalia Heading, MD. Schedule an appointment as soon as possible for a visit on 11/08/2013.   Specialty:  General Surgery   Contact information:   [address] [place] [address] [phone]       Signed: Franky Macho A 11/03/2013, 8:38 AM

## 2013-11-04 LAB — CULTURE, ROUTINE-ABSCESS: Culture: NO GROWTH

## 2013-11-04 LAB — TYPE AND SCREEN
ABO/RH(D): A POS
Antibody Screen: NEGATIVE
Unit division: 0
Unit division: 0
Unit division: 0
Unit division: 0

## 2013-11-06 LAB — ANAEROBIC CULTURE

## 2013-11-16 NOTE — Progress Notes (Signed)
UR chart review completed.  

## 2013-11-30 LAB — FUNGUS CULTURE W SMEAR: Fungal Smear: NONE SEEN

## 2014-03-16 ENCOUNTER — Encounter (HOSPITAL_COMMUNITY)
Admission: AD | Disposition: A | Discharge status: Home Health | Payer: Self-pay | Source: Other Acute Inpatient Hospital | Attending: Emergency Medicine

## 2014-03-16 ENCOUNTER — Inpatient Hospital Stay (HOSPITAL_COMMUNITY): Payer: Self-pay

## 2014-03-16 ENCOUNTER — Encounter (HOSPITAL_COMMUNITY): Payer: Self-pay | Admitting: Internal Medicine

## 2014-03-16 ENCOUNTER — Inpatient Hospital Stay (HOSPITAL_COMMUNITY)
Admission: AD | Admit: 2014-03-16 | Discharge: 2014-03-22 | DRG: 326 | Disposition: A | Discharge status: Home Health | Payer: Self-pay | Source: Other Acute Inpatient Hospital | Attending: Internal Medicine | Admitting: Internal Medicine

## 2014-03-16 DIAGNOSIS — R578 Other shock: Secondary | ICD-10-CM | POA: Diagnosis present

## 2014-03-16 DIAGNOSIS — K219 Gastro-esophageal reflux disease without esophagitis: Secondary | ICD-10-CM | POA: Diagnosis present

## 2014-03-16 DIAGNOSIS — K920 Hematemesis: Secondary | ICD-10-CM

## 2014-03-16 DIAGNOSIS — E876 Hypokalemia: Secondary | ICD-10-CM | POA: Diagnosis not present

## 2014-03-16 DIAGNOSIS — Z598 Other problems related to housing and economic circumstances: Secondary | ICD-10-CM

## 2014-03-16 DIAGNOSIS — B954 Other streptococcus as the cause of diseases classified elsewhere: Secondary | ICD-10-CM | POA: Diagnosis present

## 2014-03-16 DIAGNOSIS — K921 Melena: Secondary | ICD-10-CM

## 2014-03-16 DIAGNOSIS — F17210 Nicotine dependence, cigarettes, uncomplicated: Secondary | ICD-10-CM | POA: Diagnosis present

## 2014-03-16 DIAGNOSIS — F1910 Other psychoactive substance abuse, uncomplicated: Secondary | ICD-10-CM | POA: Diagnosis present

## 2014-03-16 DIAGNOSIS — R110 Nausea: Secondary | ICD-10-CM

## 2014-03-16 DIAGNOSIS — B955 Unspecified streptococcus as the cause of diseases classified elsewhere: Secondary | ICD-10-CM | POA: Insufficient documentation

## 2014-03-16 DIAGNOSIS — D649 Anemia, unspecified: Secondary | ICD-10-CM | POA: Diagnosis present

## 2014-03-16 DIAGNOSIS — Z23 Encounter for immunization: Secondary | ICD-10-CM

## 2014-03-16 DIAGNOSIS — R739 Hyperglycemia, unspecified: Secondary | ICD-10-CM | POA: Diagnosis present

## 2014-03-16 DIAGNOSIS — D62 Acute posthemorrhagic anemia: Secondary | ICD-10-CM | POA: Diagnosis present

## 2014-03-16 DIAGNOSIS — R1013 Epigastric pain: Secondary | ICD-10-CM

## 2014-03-16 DIAGNOSIS — K922 Gastrointestinal hemorrhage, unspecified: Secondary | ICD-10-CM | POA: Diagnosis present

## 2014-03-16 DIAGNOSIS — K264 Chronic or unspecified duodenal ulcer with hemorrhage: Principal | ICD-10-CM | POA: Diagnosis present

## 2014-03-16 DIAGNOSIS — Z452 Encounter for adjustment and management of vascular access device: Secondary | ICD-10-CM

## 2014-03-16 DIAGNOSIS — Z791 Long term (current) use of non-steroidal anti-inflammatories (NSAID): Secondary | ICD-10-CM

## 2014-03-16 DIAGNOSIS — K50914 Crohn's disease, unspecified, with abscess: Secondary | ICD-10-CM

## 2014-03-16 DIAGNOSIS — G894 Chronic pain syndrome: Secondary | ICD-10-CM | POA: Diagnosis present

## 2014-03-16 DIAGNOSIS — Z9119 Patient's noncompliance with other medical treatment and regimen: Secondary | ICD-10-CM | POA: Diagnosis present

## 2014-03-16 DIAGNOSIS — K5000 Crohn's disease of small intestine without complications: Secondary | ICD-10-CM | POA: Diagnosis present

## 2014-03-16 DIAGNOSIS — T39395A Adverse effect of other nonsteroidal anti-inflammatory drugs [NSAID], initial encounter: Secondary | ICD-10-CM | POA: Diagnosis present

## 2014-03-16 DIAGNOSIS — R7881 Bacteremia: Secondary | ICD-10-CM | POA: Diagnosis present

## 2014-03-16 HISTORY — PX: ESOPHAGOGASTRODUODENOSCOPY: SHX5428

## 2014-03-16 LAB — CBC
HCT: 25.6 % — ABNORMAL LOW (ref 39.0–52.0)
HCT: 33.9 % — ABNORMAL LOW (ref 39.0–52.0)
Hemoglobin: 8.6 g/dL — ABNORMAL LOW (ref 13.0–17.0)
Hemoglobin: 11.6 g/dL — ABNORMAL LOW (ref 13.0–17.0)
MCH: 29.4 pg (ref 26.0–34.0)
MCH: 29.4 pg (ref 26.0–34.0)
MCHC: 33.6 g/dL (ref 30.0–36.0)
MCHC: 34.2 g/dL (ref 30.0–36.0)
MCV: 87.4 fL (ref 78.0–100.0)
MCV: 85.8 fL (ref 78.0–100.0)
Platelets: 353 10*3/uL (ref 150–400)
Platelets: 297 10*3/uL (ref 150–400)
RBC: 2.93 MIL/uL — ABNORMAL LOW (ref 4.22–5.81)
RBC: 3.95 MIL/uL — ABNORMAL LOW (ref 4.22–5.81)
RDW: 16 % — ABNORMAL HIGH (ref 11.5–15.5)
RDW: 15.1 % (ref 11.5–15.5)
WBC: 20.3 10*3/uL — ABNORMAL HIGH (ref 4.0–10.5)
WBC: 35 10*3/uL — ABNORMAL HIGH (ref 4.0–10.5)

## 2014-03-16 LAB — BLOOD GAS, ARTERIAL
Acid-base deficit: 8.8 mmol/L — ABNORMAL HIGH (ref 0.0–2.0)
Bicarbonate: 15.8 mEq/L — ABNORMAL LOW (ref 20.0–24.0)
FIO2: 0.6 %
MECHVT: 550 mL
O2 Saturation: 99.5 %
PEEP: 5 cmH2O
Patient temperature: 98.6
RATE: 16 resp/min
TCO2: 16.7 mmol/L (ref 0–100)
pCO2 arterial: 30.1 mmHg — ABNORMAL LOW (ref 35.0–45.0)
pH, Arterial: 7.341 — ABNORMAL LOW (ref 7.350–7.450)
pO2, Arterial: 328 mmHg — ABNORMAL HIGH (ref 80.0–100.0)

## 2014-03-16 LAB — GLUCOSE, CAPILLARY: Glucose-Capillary: 158 mg/dL — ABNORMAL HIGH (ref 70–99)

## 2014-03-16 LAB — URINALYSIS, ROUTINE W REFLEX MICROSCOPIC
Bilirubin Urine: NEGATIVE
Glucose, UA: NEGATIVE mg/dL
Ketones, ur: NEGATIVE mg/dL
Leukocytes, UA: NEGATIVE
Nitrite: NEGATIVE
Protein, ur: NEGATIVE mg/dL
Specific Gravity, Urine: >1.046 — ABNORMAL HIGH (ref 1.005–1.030)
Urobilinogen, UA: 0.2 mg/dL (ref 0.0–1.0)
pH: 6 (ref 5.0–8.0)

## 2014-03-16 LAB — COMPREHENSIVE METABOLIC PANEL
ALT: 12 U/L (ref 0–53)
AST: 20 U/L (ref 0–37)
Albumin: 1.2 g/dL — ABNORMAL LOW (ref 3.5–5.2)
Alkaline Phosphatase: 24 U/L — ABNORMAL LOW (ref 39–117)
Anion gap: 3 — ABNORMAL LOW (ref 5–15)
BUN: 21 mg/dL (ref 6–23)
CO2: 18 mmol/L — ABNORMAL LOW (ref 19–32)
Calcium: 6.4 mg/dL — CL (ref 8.4–10.5)
Chloride: 123 mmol/L — ABNORMAL HIGH (ref 96–112)
Creatinine, Ser: 1.01 mg/dL (ref 0.50–1.35)
GFR calc Af Amer: >90 mL/min (ref >90)
GFR calc non Af Amer: >90 mL/min (ref >90)
Glucose, Bld: 181 mg/dL — ABNORMAL HIGH (ref 70–99)
Potassium: 4.1 mmol/L (ref 3.5–5.1)
Sodium: 144 mmol/L (ref 135–145)
Total Bilirubin: 0.3 mg/dL (ref 0.3–1.2)
Total Protein: <3 g/dL — ABNORMAL LOW (ref 6.0–8.3)

## 2014-03-16 LAB — TROPONIN I: Troponin I: 0.19 ng/mL — ABNORMAL HIGH (ref <0.031)

## 2014-03-16 LAB — MAGNESIUM: Magnesium: 1.4 mg/dL — ABNORMAL LOW (ref 1.5–2.5)

## 2014-03-16 LAB — PREPARE RBC (CROSSMATCH)

## 2014-03-16 LAB — URINE MICROSCOPIC-ADD ON

## 2014-03-16 LAB — LACTIC ACID, PLASMA: Lactic Acid, Venous: 3.9 mmol/L (ref 0.5–2.0)

## 2014-03-16 LAB — PHOSPHORUS: Phosphorus: 3.8 mg/dL (ref 2.3–4.6)

## 2014-03-16 LAB — MRSA PCR SCREENING: MRSA by PCR: NEGATIVE

## 2014-03-16 LAB — TRIGLYCERIDES: Triglycerides: 80 mg/dL (ref <150)

## 2014-03-16 LAB — PROCALCITONIN: Procalcitonin: 0.56 ng/mL

## 2014-03-16 SURGERY — EGD (ESOPHAGOGASTRODUODENOSCOPY)
Anesthesia: Moderate Sedation

## 2014-03-16 MED ORDER — VANCOMYCIN HCL IN DEXTROSE 1-5 GM/200ML-% IV SOLN
1000.0000 mg | Freq: Three times a day (TID) | INTRAVENOUS | Status: DC
  Administered 2014-03-16 – 2014-03-18 (×5): 1000 mg via INTRAVENOUS
  Filled 2014-03-16 (×7): qty 200

## 2014-03-16 MED ORDER — SODIUM CHLORIDE 0.9 % IV SOLN: 2.0000 mg/h | INTRAVENOUS | Status: DC

## 2014-03-16 MED ORDER — DEXTROSE 5 % IV SOLN
0.0000 ug/min | INTRAVENOUS | Status: DC
  Administered 2014-03-16: 35 ug/min via INTRAVENOUS
  Administered 2014-03-17: 10 ug/min via INTRAVENOUS
  Filled 2014-03-16 (×2): qty 16

## 2014-03-16 MED ORDER — CIPROFLOXACIN IN D5W 400 MG/200ML IV SOLN
400.0000 mg | Freq: Two times a day (BID) | INTRAVENOUS | Status: DC
  Administered 2014-03-16 – 2014-03-18 (×4): 400 mg via INTRAVENOUS
  Filled 2014-03-16 (×6): qty 200

## 2014-03-16 MED ORDER — FENTANYL BOLUS VIA INFUSION
50.0000 ug | INTRAVENOUS | Status: DC | PRN
  Filled 2014-03-16: qty 50

## 2014-03-16 MED ORDER — MIDAZOLAM HCL 2 MG/2ML IJ SOLN
2.0000 mg | INTRAMUSCULAR | Status: DC | PRN
  Administered 2014-03-17: 2 mg via INTRAVENOUS
  Filled 2014-03-16: qty 2

## 2014-03-16 MED ORDER — FENTANYL CITRATE 0.05 MG/ML IJ SOLN: 50.0000 ug | Freq: Once | INTRAMUSCULAR | Status: AC

## 2014-03-16 MED ORDER — METRONIDAZOLE IN NACL 5-0.79 MG/ML-% IV SOLN
500.0000 mg | Freq: Three times a day (TID) | INTRAVENOUS | Status: DC
  Administered 2014-03-16 – 2014-03-18 (×6): 500 mg via INTRAVENOUS
  Filled 2014-03-16 (×7): qty 100

## 2014-03-16 MED ORDER — SODIUM CHLORIDE 0.9 % IV SOLN
Freq: Once | INTRAVENOUS | Status: AC
  Administered 2014-03-16: 21:00:00 via INTRAVENOUS

## 2014-03-16 MED ORDER — MIDAZOLAM HCL 2 MG/2ML IJ SOLN
4.0000 mg | Freq: Once | INTRAMUSCULAR | Status: AC
  Administered 2014-03-16: 4 mg via INTRAVENOUS

## 2014-03-16 MED ORDER — PROPOFOL 10 MG/ML IV EMUL
0.0000 ug/kg/min | INTRAVENOUS | Status: DC
  Administered 2014-03-16: 20 ug/kg/min via INTRAVENOUS
  Administered 2014-03-17: 30 ug/kg/min via INTRAVENOUS
  Administered 2014-03-18: 12 ug/kg/min via INTRAVENOUS
  Filled 2014-03-16 (×4): qty 100

## 2014-03-16 MED ORDER — ERYTHROMYCIN LACTOBIONATE 500 MG IV SOLR
500.0000 mg | Freq: Once | INTRAVENOUS | Status: DC
  Filled 2014-03-16: qty 10

## 2014-03-16 MED ORDER — MIDAZOLAM HCL 2 MG/2ML IJ SOLN
INTRAMUSCULAR | Status: AC
  Filled 2014-03-16: qty 4

## 2014-03-16 MED ORDER — DEXTROSE-NACL 5-0.45 % IV SOLN
INTRAVENOUS | Status: DC
  Administered 2014-03-16: 50 mL/h via INTRAVENOUS
  Administered 2014-03-16 – 2014-03-19 (×3): via INTRAVENOUS

## 2014-03-16 MED ORDER — NOREPINEPHRINE BITARTRATE 1 MG/ML IV SOLN
0.0000 ug/min | INTRAVENOUS | Status: DC
  Administered 2014-03-16: 10 ug/min via INTRAVENOUS
  Filled 2014-03-16: qty 4

## 2014-03-16 MED ORDER — PROPOFOL 10 MG/ML IV EMUL
INTRAVENOUS | Status: AC
Start: 2014-03-16 — End: 2014-03-16
  Filled 2014-03-16: qty 100

## 2014-03-16 MED ORDER — CHLORHEXIDINE GLUCONATE 0.12 % MT SOLN
15.0000 mL | Freq: Two times a day (BID) | OROMUCOSAL | Status: DC
  Administered 2014-03-17 – 2014-03-18 (×3): 15 mL via OROMUCOSAL
  Filled 2014-03-16 (×3): qty 15

## 2014-03-16 MED ORDER — SODIUM CHLORIDE 0.9 % IV SOLN
8.0000 mg/h | INTRAVENOUS | Status: AC
  Administered 2014-03-16 – 2014-03-19 (×5): 8 mg/h via INTRAVENOUS
  Filled 2014-03-16 (×12): qty 80

## 2014-03-16 MED ORDER — FENTANYL CITRATE 0.05 MG/ML IJ SOLN
INTRAMUSCULAR | Status: AC
  Filled 2014-03-16: qty 2

## 2014-03-16 MED ORDER — MIDAZOLAM HCL 2 MG/2ML IJ SOLN
3.0000 mg | Freq: Once | INTRAMUSCULAR | Status: AC
  Administered 2014-03-16: 3 mg via INTRAVENOUS

## 2014-03-16 MED ORDER — SODIUM CHLORIDE 0.9 % IJ SOLN
10.0000 mL | Freq: Two times a day (BID) | INTRAMUSCULAR | Status: DC
  Administered 2014-03-16: 40 mL
  Administered 2014-03-17: 30 mL
  Administered 2014-03-17 – 2014-03-21 (×5): 10 mL

## 2014-03-16 MED ORDER — SODIUM CHLORIDE 0.9 % IV SOLN
25.0000 ug/h | INTRAVENOUS | Status: DC
  Administered 2014-03-16: 50 ug/h via INTRAVENOUS
  Filled 2014-03-16: qty 50

## 2014-03-16 MED ORDER — FENTANYL CITRATE 0.05 MG/ML IJ SOLN
100.0000 ug | Freq: Once | INTRAMUSCULAR | Status: AC
  Administered 2014-03-16: 100 ug via INTRAVENOUS

## 2014-03-16 MED ORDER — SODIUM CHLORIDE 0.9 % IV SOLN: Freq: Once | INTRAVENOUS | Status: DC

## 2014-03-16 MED ORDER — ETOMIDATE 2 MG/ML IV SOLN
30.0000 mg | Freq: Once | INTRAVENOUS | Status: AC
  Administered 2014-03-16: 30 mg via INTRAVENOUS

## 2014-03-16 MED ORDER — MIDAZOLAM HCL 2 MG/2ML IJ SOLN
INTRAMUSCULAR | Status: AC
  Filled 2014-03-16: qty 2

## 2014-03-16 MED ORDER — ONDANSETRON HCL 4 MG/2ML IJ SOLN: 4.0000 mg | Freq: Four times a day (QID) | INTRAMUSCULAR | Status: DC | PRN

## 2014-03-16 MED ORDER — CETYLPYRIDINIUM CHLORIDE 0.05 % MT LIQD
7.0000 mL | Freq: Four times a day (QID) | OROMUCOSAL | Status: DC
  Administered 2014-03-17 – 2014-03-18 (×6): 7 mL via OROMUCOSAL

## 2014-03-16 MED ORDER — SODIUM CHLORIDE 0.9 % IV BOLUS (SEPSIS)
1000.0000 mL | Freq: Once | INTRAVENOUS | Status: AC
  Administered 2014-03-16: 1000 mL via INTRAVENOUS

## 2014-03-16 MED ORDER — SODIUM CHLORIDE 0.9 % IJ SOLN
10.0000 mL | INTRAMUSCULAR | Status: DC | PRN
Start: 2014-03-16 — End: 2014-03-22
  Administered 2014-03-17: 40 mL
  Administered 2014-03-20: 20 mL
  Filled 2014-03-16 (×2): qty 40

## 2014-03-16 MED ORDER — MIDAZOLAM HCL 2 MG/2ML IJ SOLN: 2.0000 mg | INTRAMUSCULAR | Status: DC | PRN

## 2014-03-16 MED ORDER — SODIUM CHLORIDE 0.9 % IV SOLN
2.0000 mg/h | INTRAVENOUS | Status: DC
  Administered 2014-03-16: 1 mg/h via INTRAVENOUS
  Administered 2014-03-17: 3 mg/h via INTRAVENOUS
  Filled 2014-03-16 (×3): qty 20

## 2014-03-16 MED ORDER — SODIUM CHLORIDE 0.9 % IJ SOLN
PREFILLED_SYRINGE | INTRAMUSCULAR | Status: DC | PRN
  Administered 2014-03-16: 8 mL

## 2014-03-16 NOTE — Consult Note (Signed)
HISTORY OF PRESENT ILLNESS:  Shane Stone is a 29 y.o. male with a history of ileal Crohn's colitis and medical noncompliance who is transferred to this hospital with acute upper GI bleeding. I am asked by critical care medicine to see him for the same. He has just arrived to this hospital a short while ago. All of the patient's GI care has been elsewhere. Earlier today developed hematemesis followed by melena. He was dizzy, weak, and hypotensive. He has been resuscitated with fluid and blood with improvement in vital signs. He does complain of abdominal pain in the midportion. No vomiting or bowel movements for several hours. He does take NSAIDs on a regular basis for various pains  REVIEW OF SYSTEMS:  All non-GI ROS negative except for fatigue  Past Medical History  Diagnosis Date  . Crohn's     Past Surgical History  Procedure Laterality Date  . Mouth surgery  2010  . Small intestine surgery      from infection. no intestines removed.     Social History Shane Stone  reports that he has been smoking Cigarettes.  He has a 9 pack-year smoking history. He has never used smokeless tobacco. He reports that he does not drink alcohol or use illicit drugs.  family history is negative for Colon cancer, Crohn's disease, and Inflammatory bowel disease.  No Known Allergies     PHYSICAL EXAMINATION: Vital signs: There were no vitals taken for this visit. Marland Kitchen Systolic blood pressure 114, heart rate 90, respirations 14 Constitutional: Fatigued and pale-appearing, no acute distress Psychiatric: alert and oriented x3, cooperative Eyes: extraocular movements intact, anicteric, conjunctiva pale Mouth: oral pharynx moist, no lesions Neck: supple no lymphadenopathy Cardiovascular: heart regular rate and rhythm, no murmur Lungs: clear to auscultation bilaterally Abdomen: soft, mild mid abdominal tenderness, nondistended, no obvious ascites, no peritoneal signs, normal bowel sounds, no  organomegaly Rectal: Omitted Extremities: no lower extremity edema bilaterally Skin: no lesions on visible extremities. Multiple tattoos Neuro: No obvious focal deficits.   ASSESSMENT:  1. Acute upper GI bleed with transient hypotension. Has responded to initial resuscitative measures. Suspect NSAID induced ulcer 2. History of Crohn's ileocolitis complicated by abscess status post transient percutaneous drainage September 2015 3. History of chronic anemia 4. History of medical noncompliance   PLAN:  1. Maximize IV access. Agree with central line 2. Await blood work. All pending 3. Transfuse to keep hemoglobin greater than 8 or as dictated by hemodynamics 4. Protonix bolus followed by Protonix IV continuous drip 5. Upper endoscopy. The patient remains stable, would plan on performing an a.m. Sooner if needed. Would intubate this individual for the examination to protect his airway.  Discussed this with the patient. Also discussed with Dr. Delton Coombes. Thank you

## 2014-03-16 NOTE — Op Note (Signed)
EGD REPORT (formal [facility] report to follow)  IMPRESSION 1. Giant duodenal bulb ulcer with massive bleeding. Large pigmented protuberance injected with epinephrine and subsequently clipped. The second visible vessel with vigorous active bleeding was injected with epinephrine followed by BiCAP coaptation. Hemostasis achieved. Limited views of the esophagus and stomach due to massive amounts of blood and blood clots. CLO biopsy taken  RECOMMENDATIONS 1. Transfuse to hemoglobin greater than 8 2. Continue IV Protonix drip 3. This ulcer has a high risk of rebleeding based on the this will size. Thus, Gen. surgery (Dr. Dwain Sarna) and interventional radiology (Dr. Fredia Sorrow) made aware this patient.  Shane Stone. Eda Keys., M.D. [facility] Division of Gastroenterology

## 2014-03-16 NOTE — Progress Notes (Signed)
eLink Physician-Brief Progress Note Patient Name: Shane Stone DOB: 05-Nov-1985 MRN: [id number]   Date of Service  03/16/2014  HPI/Events of Note  Reports only med that works for his chronic pain is dilaudid and agitated on fentanyl drip  eICU Interventions  Try dilaudid drip 2-4 mg per hour      Intervention Category Minor Interventions: Routine modifications to care plan (e.g. PRN medications for pain, fever)  Sandrea Hughs 03/16/2014, 10:41 PM

## 2014-03-16 NOTE — H&P (Signed)
PULMONARY / CRITICAL CARE MEDICINE   Name: Shane Stone MRN: [id number] DOB: 10/08/1985    ADMISSION DATE:  03/16/2014  CHIEF COMPLAINT:  Abdominal pain, GIB  INITIAL PRESENTATION: 29 yo man, hx Crohn's, narcotics use, admitted with massive GIB, suspected PUD / ulcer. Admitted for stabilization 1/23 to facilitate EGD.   STUDIES:   SIGNIFICANT EVENTS:  HISTORY OF PRESENT ILLNESS:  29 yo man, hx of tobacco, Crohn's colitis dx in 2012 (formerly on pentasa, steroids per notes), intra-abd abscess drained percutaneously last in 10/2013. He reports that soon afterwards he had swelling in the area and then purulent drainage at the former catheter site for months. He woke 1/23 feeling nauseated, RUQ pain and "funny headed", then went to the bathroom and immediately had emesis of large volume BRB. He called for help, had syncope and was brought to [facility] urgently. His Hgb on arrival was 7.1, and then he had more bloody emesis x 1, maroon BM x 2. He was started on protonix gtt and PRBC resuscitation initiated. BP was as low as 70's systolic. He is finishing his 4th u PRBC on transfer to [facility]. PCCM admitting to ICU. He reports significant NSAID use for his pain   PAST MEDICAL HISTORY :   has a past medical history of Crohn's.  has past surgical history that includes Mouth surgery (2010) and Small intestine surgery. Prior to Admission medications   Medication Sig Start Date End Date Taking? Authorizing Provider  ciprofloxacin (CIPRO) 500 MG tablet Take 1 tablet (500 mg total) by mouth 2 (two) times daily. 11/03/13   Dalia Heading, MD  ibuprofen (ADVIL,MOTRIN) 200 MG tablet Take 200 mg by mouth every 6 (six) hours as needed for headache, mild pain or moderate pain.    Historical Provider, MD  metroNIDAZOLE (FLAGYL) 500 MG tablet Take 1 tablet (500 mg total) by mouth 2 (two) times daily. 11/03/13   Dalia Heading, MD  oxyCODONE-acetaminophen (PERCOCET) 7.5-325 MG per tablet Take 1-2 tablets by mouth  every 4 (four) hours as needed. 11/03/13   Dalia Heading, MD   No Known Allergies  FAMILY HISTORY:  has no family status information on file.  SOCIAL HISTORY:  reports that he has been smoking Cigarettes.  He has a 9 pack-year smoking history. He has never used smokeless tobacco. He reports that he does not drink alcohol or use illicit drugs.  REVIEW OF SYSTEMS:   C/o maroon stool, RUQ abd pain, dizziness, bloody emesis  SUBJECTIVE:  C/o abd pain, wants narcotics before he will allow NGT or CVC  VITAL SIGNS:   HEMODYNAMICS:   VENTILATOR SETTINGS:   INTAKE / OUTPUT: No intake or output data in the 24 hours ending 03/16/14 1714  PHYSICAL EXAMINATION: General:  Pale young man, ill appearing,  Neuro:  Awake, globally weak, moves all ext,  HEENT:  Pale mucosa, OP dry, PERRL Cardiovascular:  Regular, no M Lungs:  Clear B  Abdomen:  Tender RUQ and less so throughout, hypoactive BS Musculoskeletal:  No deformities Skin:  Pale and cool, no rash  LABS:  CBC No results for input(s): WBC, HGB, HCT, PLT in the last 168 hours. Coag's No results for input(s): APTT, INR in the last 168 hours. BMET No results for input(s): NA, K, CL, CO2, BUN, CREATININE, GLUCOSE in the last 168 hours. Electrolytes No results for input(s): CALCIUM, MG, PHOS in the last 168 hours. Sepsis Markers No results for input(s): LATICACIDVEN, PROCALCITON, O2SATVEN in the last 168 hours. ABG No  results for input(s): PHART, PCO2ART, PO2ART in the last 168 hours. Liver Enzymes No results for input(s): AST, ALT, ALKPHOS, BILITOT, ALBUMIN in the last 168 hours. Cardiac Enzymes No results for input(s): TROPONINI, PROBNP in the last 168 hours. Glucose No results for input(s): GLUCAP in the last 168 hours.  Imaging No results found.   ASSESSMENT / PLAN:  GASTROINTESTINAL A:  Acute GIB, appears to be upper source. Suspect PUD/ulcer given his significant NSAID use Crohn's disease, not currently on rx. No  clear evidence for a flare beyond his abdominal pain.  Hx intra-abd abscesses s/p drainage, last 10/2013 P:   - Appreciate Dr Lamar Sprinkles assistance, pt will need EGD once stabilized - will defer steroids at this time given potential impact on PUD and the lingering question of infxn  - empiric abx as below - protonix gtt - zofran prn - follow freq CBC and give PRBC for goal Hgb > 8.0 - will likely need Ct abd at some point, will defer timing to Dr Marina Goodell  PULMONARY OETT A: At risk aspiration if he requires EGD P:   - Have discussed with Dr Marina Goodell and agree that he would be a high-risk EGD without airway protection. Given his continues hemorrhage I believe we should intubate now, facilitate the EGD eval - aspiration precautions   CARDIOVASCULAR CVL 1/23 >>  A: hemorrhagic shock, responding to PRBC and volume P:  - place CVC now to facilitate meds, CVP's, blood  RENAL A:  No evidence renal injury based on S Cr 1.0 from [place], but he is at risk due to shock state, NSAID use P:   - BMP now - avoid nephrotoxins - volume resuscitation  HEMATOLOGIC A:  Acute blood loss anemia P:  - PRBC replacement, goal Hgb > 8.0 - follow CBC q4-6h  INFECTIOUS A:  Possible R abdominal cellulitis / subcutaneous abscess Possible intra-abd infection P:   BCx2 1/23 >>  UC 1/23 >>   vanco 1/23 >>  cipro 1/23 >>  Flagyl 1/23 >>   Empiric abx started 1/23 as above  ENDOCRINE A:  Hyperglycemia P:  - follow CBC on BMP's  NEUROLOGIC A:  Lethargy due to hypotension P:   RASS goal: 0 - follow MS with resuscitation   FAMILY  - Updates:   - Inter-disciplinary family meet or Palliative Care meeting due by:  03/23/14    TODAY'S SUMMARY: Acute GIB with associated shock, critically ill with active massive hematemesis. Dr Marina Goodell has evaluated and will perform EGD  Independent CC time 80 minutes  Levy Pupa, MD, PhD 03/16/2014, 6:22 PM [facility] [phone] or if no  answer [phone]

## 2014-03-16 NOTE — Interval H&P Note (Signed)
History and Physical Interval Note:  Large volume hematemesis after initial assessment. Proceed with emergent EGD. 03/16/2014 7:08 PM  Shane Stone  has presented today for surgery, with the diagnosis of gi bleed  The various methods of treatment have been discussed with the patient and family. After consideration of risks, benefits and other options for treatment, the patient has consented to  Procedure(s) with comments: ESOPHAGOGASTRODUODENOSCOPY (EGD) (N/A) - bedside as a surgical intervention .  The patient's history has been reviewed, patient examined, no change in status, stable for surgery.  I have reviewed the patient's chart and labs.  Questions were answered to the patient's satisfaction.     Yancey Flemings

## 2014-03-16 NOTE — Progress Notes (Addendum)
CRITICAL VALUE ALERT  Critical value received:  Ca 6.4, Lactic Acid 3.9  Date of notification:  03/16/14  Time of notification:  1941  Critical value read back:Yes.    Nurse who received alert:  Holland Falling RN  MD notified (1st page):  Dr. Sherene Sires (elink)  Time of first page:  1950  MD notified (2nd page):  Time of second page:  Responding MD:  Dr. Sherene Sires  Time MD responded:  Called directly

## 2014-03-16 NOTE — Progress Notes (Signed)
Pt wrote note saying that he uses Dilaudid for his pain control and it works better than Fentanyl. Dr. Sherene Sires called to discuss pain management.

## 2014-03-16 NOTE — Progress Notes (Signed)
Fentanyl gtt d/c'd and Dilaudid gtt started, Fentanyl wasted and rinsed down sink, 2 RNs witnessed: Development worker, international aid and Alfonso Ellis RN

## 2014-03-16 NOTE — Procedures (Signed)
Central Venous Catheter Insertion Procedure Note JULIA DEC [id number] 12/29/1985  Procedure: Insertion of Central Venous Catheter Indications: Drug and/or fluid administration  Procedure Details Consent: Risks of procedure as well as the alternatives and risks of each were explained to the (patient/caregiver).  Consent for procedure obtained. Time Out: Verified patient identification, verified procedure, site/side was marked, verified correct patient position, special equipment/implants available, medications/allergies/relevent history reviewed, required imaging and test results available.  Performed  Maximum sterile technique was used including antiseptics, cap, gloves, gown, hand hygiene, mask and sheet. Skin prep: Chlorhexidine; local anesthetic administered A antimicrobial bonded/coated triple lumen catheter was placed in the right internal jugular vein using the Seldinger technique.  Evaluation Blood flow good Complications: No apparent complications Patient did tolerate procedure well. Chest X-ray ordered to verify placement.  CXR: normal.   Levy Pupa, MD, PhD 03/16/2014, 7:17 PM [facility] [phone] or if no answer [phone]

## 2014-03-16 NOTE — Progress Notes (Addendum)
eLink Physician-Brief Progress Note Patient Name: AMAURY KUZEL DOB: June 18, 1985 MRN: [id number]   Date of Service  03/16/2014  HPI/Events of Note  Hypocalcemia ? Needs treatment ekg fine   eICU Interventions  Check abgs / monitor ekg     Intervention Category Major Interventions: Acid-Base disturbance - evaluation and management  Sandrea Hughs 03/16/2014, 8:25 PM

## 2014-03-16 NOTE — Progress Notes (Signed)
ANTIBIOTIC CONSULT NOTE - INITIAL  Pharmacy Consult for Vancomycin / Cipro Indication: Wound infection / Intrabdominal infection  No Known Allergies  Patient Measurements:   Total Body Weight: 62kg   Vital Signs:   Intake/Output from previous day:   Intake/Output from this shift: Total I/O In: -  Out: 400 [Emesis/NG output:400]  Labs: No results for input(s): WBC, HGB, PLT, LABCREA, CREATININE in the last 72 hours. CrCl cannot be calculated (Unknown ideal weight.). No results for input(s): VANCOTROUGH, VANCOPEAK, VANCORANDOM, GENTTROUGH, GENTPEAK, GENTRANDOM, TOBRATROUGH, TOBRAPEAK, TOBRARND, AMIKACINPEAK, AMIKACINTROU, AMIKACIN in the last 72 hours.   Microbiology: No results found for this or any previous visit (from the past 720 hour(s)).  Medical History: Past Medical History  Diagnosis Date  . Crohn's      Assessment: 29yom with Chron's Dz colitis and intrabdominal abscess drained 9/15.  He has had purulent drainage from former catheter site.  Baseline labs drawn at [facility] WBC 26, Cr 1.  Broad spectrum ABX - vancomycin, cipro, flagyl -to start - none given at OSH.    Goal of Therapy:  Vancomycin trough level 15-20 mcg/ml  Plan:  Cipro  IV q12 Vancomycin 1gm IV q8 Metronidazole  IV q8  Leota Sauers Pharm.D. CPP, BCPS Clinical Pharmacist [phone] 03/16/2014 6:12 PM

## 2014-03-16 NOTE — Procedures (Signed)
Intubation Procedure Note Shane Stone [id number] Sep 06, 1985  Procedure: Intubation Indications: Airway protection and maintenance  Procedure Details Consent: Risks of procedure as well as the alternatives and risks of each were explained to the (patient/caregiver).  Consent for procedure obtained. Time Out: Verified patient identification, verified procedure, site/side was marked, verified correct patient position, special equipment/implants available, medications/allergies/relevent history reviewed, required imaging and test results available.  Performed  Maximum sterile technique was used including cap, gloves, gown, hand hygiene and mask.  3.0 MAC glide scope    Evaluation Hemodynamic Status: BP stable throughout; O2 sats: stable throughout Patient's Current Condition: critical but stable Complications: No apparent complications Patient did tolerate procedure well. Chest X-ray ordered to verify placement.  CXR: tube position acceptable.   Levy Pupa, MD, PhD 03/16/2014, 7:19 PM [facility] [phone] or if no answer [phone]

## 2014-03-16 NOTE — H&P (View-Only) (Signed)
HISTORY OF PRESENT ILLNESS:  Shane Stone is a 29 y.o. male with a history of ileal Crohn's colitis and medical noncompliance who is transferred to this hospital with acute upper GI bleeding. I am asked by critical care medicine to see him for the same. He has just arrived to this hospital a short while ago. All of the patient's GI care has been elsewhere. Earlier today developed hematemesis followed by melena. He was dizzy, weak, and hypotensive. He has been resuscitated with fluid and blood with improvement in vital signs. He does complain of abdominal pain in the midportion. No vomiting or bowel movements for several hours. He does take NSAIDs on a regular basis for various pains  REVIEW OF SYSTEMS:  All non-GI ROS negative except for fatigue  Past Medical History  Diagnosis Date  . Crohn's     Past Surgical History  Procedure Laterality Date  . Mouth surgery  2010  . Small intestine surgery      from infection. no intestines removed.     Social History Matthews H Coleson  reports that he has been smoking Cigarettes.  He has a 9 pack-year smoking history. He has never used smokeless tobacco. He reports that he does not drink alcohol or use illicit drugs.  family history is negative for Colon cancer, Crohn's disease, and Inflammatory bowel disease.  No Known Allergies     PHYSICAL EXAMINATION: Vital signs: There were no vitals taken for this visit. . Systolic blood pressure 114, heart rate 90, respirations 14 Constitutional: Fatigued and pale-appearing, no acute distress Psychiatric: alert and oriented x3, cooperative Eyes: extraocular movements intact, anicteric, conjunctiva pale Mouth: oral pharynx moist, no lesions Neck: supple no lymphadenopathy Cardiovascular: heart regular rate and rhythm, no murmur Lungs: clear to auscultation bilaterally Abdomen: soft, mild mid abdominal tenderness, nondistended, no obvious ascites, no peritoneal signs, normal bowel sounds, no  organomegaly Rectal: Omitted Extremities: no lower extremity edema bilaterally Skin: no lesions on visible extremities. Multiple tattoos Neuro: No obvious focal deficits.   ASSESSMENT:  1. Acute upper GI bleed with transient hypotension. Has responded to initial resuscitative measures. Suspect NSAID induced ulcer 2. History of Crohn's ileocolitis complicated by abscess status post transient percutaneous drainage September 2015 3. History of chronic anemia 4. History of medical noncompliance   PLAN:  1. Maximize IV access. Agree with central line 2. Await blood work. All pending 3. Transfuse to keep hemoglobin greater than 8 or as dictated by hemodynamics 4. Protonix bolus followed by Protonix IV continuous drip 5. Upper endoscopy. The patient remains stable, would plan on performing an a.m. Sooner if needed. Would intubate this individual for the examination to protect his airway.  Discussed this with the patient. Also discussed with Dr. Byrum. Thank you         

## 2014-03-16 NOTE — Consult Note (Signed)
Reason for Consult:gi bleed Referring Physician: Dr Scarlette Shorts  Shane Stone is an 29 y.o. male.  HPI: 61 yom who I was called by Dr Henrene Pastor for ugi bleed from duodenal ulcer while I was doing endoscopy. He has controlled bleeding at this time. Patient intubated.  Has received 8 units of blood today.Apparently he is using nsaids and has hx of crohns   Past Medical History  Diagnosis Date  . Crohn's     Past Surgical History  Procedure Laterality Date  . Mouth surgery  2010  . Small intestine surgery      from infection. no intestines removed.     Family History  Problem Relation Age of Onset  . Colon cancer Neg Hx   . Crohn's disease Neg Hx   . Inflammatory bowel disease Neg Hx     Social History:  reports that he has been smoking Cigarettes.  He has a 9 pack-year smoking history. He has never used smokeless tobacco. He reports that he does not drink alcohol or use illicit drugs.  Allergies: No Known Allergies  Medications: I have reviewed the patient's current medications.  Results for orders placed or performed during the hospital encounter of 03/16/14 (from the past 48 hour(s))  Glucose, capillary     Status: Abnormal   Collection Time: 03/16/14  5:16 PM  Result Value Ref Range   Glucose-Capillary 158 (H) 70 - 99 mg/dL   Comment 1 Notify RN   Comprehensive metabolic panel     Status: Abnormal   Collection Time: 03/16/14  6:30 PM  Result Value Ref Range   Sodium 144 135 - 145 mmol/L   Potassium 4.1 3.5 - 5.1 mmol/L   Chloride 123 (H) 96 - 112 mmol/L   CO2 18 (L) 19 - 32 mmol/L   Glucose, Bld 181 (H) 70 - 99 mg/dL   BUN 21 6 - 23 mg/dL   Creatinine, Ser 1.01 0.50 - 1.35 mg/dL   Calcium 6.4 (LL) 8.4 - 10.5 mg/dL    Comment: REPEATED TO VERIFY CRITICAL RESULT CALLED TO, READ BACK BY AND VERIFIED WITH: DANIELARN [id number] MCCAULEG    Total Protein <3.0 (L) 6.0 - 8.3 g/dL   Albumin 1.2 (L) 3.5 - 5.2 g/dL   AST 20 0 - 37 U/L   ALT 12 0 - 53 U/L   Alkaline  Phosphatase 24 (L) 39 - 117 U/L   Total Bilirubin 0.3 0.3 - 1.2 mg/dL   GFR calc non Af Amer >90 >90 mL/min   GFR calc Af Amer >90 >90 mL/min    Comment: (NOTE) The eGFR has been calculated using the CKD EPI equation. This calculation has not been validated in all clinical situations. eGFR's persistently <90 mL/min signify possible Chronic Kidney Disease.    Anion gap 3 (L) 5 - 15  Magnesium     Status: Abnormal   Collection Time: 03/16/14  6:30 PM  Result Value Ref Range   Magnesium 1.4 (L) 1.5 - 2.5 mg/dL  Phosphorus     Status: None   Collection Time: 03/16/14  6:30 PM  Result Value Ref Range   Phosphorus 3.8 2.3 - 4.6 mg/dL  Procalcitonin     Status: None   Collection Time: 03/16/14  6:30 PM  Result Value Ref Range   Procalcitonin 0.56 ng/mL    Comment:        Interpretation: PCT > 0.5 ng/mL and <= 2 ng/mL: Systemic infection (sepsis) is possible, but other conditions are known  to elevate PCT as well. (NOTE)         ICU PCT Algorithm               Non ICU PCT Algorithm    ----------------------------     ------------------------------         PCT < 0.25 ng/mL                 PCT < 0.1 ng/mL     Stopping of antibiotics            Stopping of antibiotics       strongly encouraged.               strongly encouraged.    ----------------------------     ------------------------------       PCT level decrease by               PCT < 0.25 ng/mL       >= 80% from peak PCT       OR PCT 0.25 - 0.5 ng/mL          Stopping of antibiotics                                             encouraged.     Stopping of antibiotics           encouraged.    ----------------------------     ------------------------------       PCT level decrease by              PCT >= 0.25 ng/mL       < 80% from peak PCT        AND PCT >= 0.5 ng/mL             Continuing antibiotics                                              encouraged.       Continuing antibiotics            encouraged.     ----------------------------     ------------------------------     PCT level increase compared          PCT > 0.5 ng/mL         with peak PCT AND          PCT >= 0.5 ng/mL             Escalation of antibiotics                                          strongly encouraged.      Escalation of antibiotics        strongly encouraged.   Lactic acid, plasma     Status: Abnormal   Collection Time: 03/16/14  6:30 PM  Result Value Ref Range   Lactic Acid, Venous 3.9 (HH) 0.5 - 2.0 mmol/L    Comment: Please note change in reference range. REPEATED TO VERIFY CRITICAL RESULT CALLED TO, READ BACK BY AND VERIFIED WITH: [place] [id number] MCCAULEG   Troponin I     Status: Abnormal  Collection Time: 03/16/14  6:30 PM  Result Value Ref Range   Troponin I 0.19 (H) <0.031 ng/mL    Comment:        PERSISTENTLY INCREASED TROPONIN VALUES IN THE RANGE OF 0.04-0.49 ng/mL CAN BE SEEN IN:       -UNSTABLE ANGINA       -CONGESTIVE HEART FAILURE       -MYOCARDITIS       -CHEST TRAUMA       -ARRYHTHMIAS       -LATE PRESENTING MYOCARDIAL INFARCTION       -COPD   CLINICAL FOLLOW-UP RECOMMENDED.   CBC     Status: Abnormal   Collection Time: 03/16/14  6:30 PM  Result Value Ref Range   WBC 20.3 (H) 4.0 - 10.5 K/uL   RBC 2.93 (L) 4.22 - 5.81 MIL/uL   Hemoglobin 8.6 (L) 13.0 - 17.0 g/dL   HCT 25.6 (L) 39.0 - 52.0 %   MCV 87.4 78.0 - 100.0 fL   MCH 29.4 26.0 - 34.0 pg   MCHC 33.6 30.0 - 36.0 g/dL   RDW 16.0 (H) 11.5 - 15.5 %   Platelets 353 150 - 400 K/uL  Type and screen     Status: None (Preliminary result)   Collection Time: 03/16/14  7:05 PM  Result Value Ref Range   ABO/RH(D) A POS    Antibody Screen NEG    Sample Expiration 03/19/2014    Unit Number [id number]    Blood Component Type RED CELLS,LR    Unit division 00    Status of Unit ISSUED    Unit tag comment VERBAL ORDERS PER DR BYRUM    Transfusion Status OK TO TRANSFUSE    Crossmatch Result PENDING    Unit Number [id number]     Blood Component Type RED CELLS,LR    Unit division 00    Status of Unit ISSUED    Unit tag comment VERBAL ORDERS PER DR BYRUM    Transfusion Status OK TO TRANSFUSE    Crossmatch Result PENDING    Unit Number [id number]    Blood Component Type RED CELLS,LR    Unit division 00    Status of Unit ISSUED    Unit tag comment VERBAL ORDERS PER DR BYRUM    Transfusion Status PENDING    Crossmatch Result PENDING    Unit Number [id number]    Blood Component Type RED CELLS,LR    Unit division 00    Status of Unit ISSUED    Unit tag comment VERBAL ORDERS PER DR BYRUM    Transfusion Status PENDING    Crossmatch Result PENDING    Unit Number [id number]    Blood Component Type RED CELLS,LR    Unit division 00    Status of Unit ISSUED    Unit tag comment VERBAL ORDERS PER DR BYRUM    Transfusion Status PENDING    Crossmatch Result PENDING    Unit Number [id number]    Blood Component Type RED CELLS,LR    Unit division 00    Status of Unit ISSUED    Unit tag comment VERBAL ORDERS PER DR BYRUM    Transfusion Status PENDING    Crossmatch Result PENDING    Unit Number [id number]    Blood Component Type RED CELLS,LR    Unit division 00    Status of Unit ISSUED    Unit tag comment VERBAL ORDERS PER DR HFWYO    Transfusion Status PENDING    Crossmatch Result  PENDING   ABO/Rh     Status: None (Preliminary result)   Collection Time: 03/16/14  7:05 PM  Result Value Ref Range   ABO/RH(D) A POS   Prepare fresh frozen plasma     Status: None (Preliminary result)   Collection Time: 03/16/14  8:30 PM  Result Value Ref Range   Unit Number [id number]    Blood Component Type THW PLS APHR    Unit division B0    Status of Unit ISSUED    Transfusion Status OK TO TRANSFUSE    Unit Number [id number]    Blood Component Type THW PLS APHR    Unit division A0    Status of Unit ISSUED    Transfusion Status OK TO TRANSFUSE    Unit Number [id number]    Blood Component Type  THAWED PLASMA    Unit division 00    Status of Unit ISSUED    Transfusion Status OK TO TRANSFUSE    Unit Number [id number]    Blood Component Type THW PLS APHR    Unit division A0    Status of Unit ISSUED    Transfusion Status OK TO TRANSFUSE     Dg Chest Port 1 View  03/16/2014   CLINICAL DATA:  Central line placement, GI bleed  EXAM: PORTABLE CHEST - 1 VIEW  COMPARISON:  03/16/2014  FINDINGS: Cardiomediastinal silhouette is stable. Endotracheal tube in place with tip 4.6 cm above the carina. NG tube coiled within stomach with tip in distal stomach. There is right IJ central line with tip in distal SVC. No pneumothorax. No acute infiltrate or pulmonary edema.  IMPRESSION: NG tube and endotracheal tube in place. Right IJ central line in place. No pneumothorax.   Electronically Signed   By: Lahoma Crocker M.D.   On: 03/16/2014 19:24    Review of Systems  Unable to perform ROS: intubated   Blood pressure 125/82, pulse 110, temperature 98.3 F (36.8 C), temperature source Axillary, resp. rate 28, height '5\' 8"'  (1.727 m), weight 136 lb 11 oz (62 kg), SpO2 100 %. Physical Exam  Vitals reviewed. HENT:  Head: Normocephalic and atraumatic.  Cardiovascular: Regular rhythm.  Tachycardia present.   Respiratory:  Coarse bilaterally  GI: Soft. He exhibits no distension.    Assessment/Plan: Bleeding du  Will give ffp to give better 1:1 ratio of products due to massive gi bleed, would continue this Will follow with you in case he needs surgery, hopefully will resolve but at risk for rebleed If rebleeds would consider repeat endo and ir first  [facility] 03/16/2014, 8:41 PM

## 2014-03-17 DIAGNOSIS — R578 Other shock: Secondary | ICD-10-CM

## 2014-03-17 DIAGNOSIS — R579 Shock, unspecified: Secondary | ICD-10-CM

## 2014-03-17 DIAGNOSIS — J9600 Acute respiratory failure, unspecified whether with hypoxia or hypercapnia: Secondary | ICD-10-CM

## 2014-03-17 LAB — CBC
HCT: 23.2 % — ABNORMAL LOW (ref 39.0–52.0)
HCT: 19.4 % — ABNORMAL LOW (ref 39.0–52.0)
HCT: 24.6 % — ABNORMAL LOW (ref 39.0–52.0)
HCT: 24.1 % — ABNORMAL LOW (ref 39.0–52.0)
Hemoglobin: 8.2 g/dL — ABNORMAL LOW (ref 13.0–17.0)
Hemoglobin: 7 g/dL — ABNORMAL LOW (ref 13.0–17.0)
Hemoglobin: 8.5 g/dL — ABNORMAL LOW (ref 13.0–17.0)
Hemoglobin: 8.4 g/dL — ABNORMAL LOW (ref 13.0–17.0)
MCH: 30 pg (ref 26.0–34.0)
MCH: 30.6 pg (ref 26.0–34.0)
MCH: 29.5 pg (ref 26.0–34.0)
MCH: 30 pg (ref 26.0–34.0)
MCHC: 35.3 g/dL (ref 30.0–36.0)
MCHC: 36.1 g/dL — ABNORMAL HIGH (ref 30.0–36.0)
MCHC: 34.6 g/dL (ref 30.0–36.0)
MCHC: 34.9 g/dL (ref 30.0–36.0)
MCV: 85 fL (ref 78.0–100.0)
MCV: 84.7 fL (ref 78.0–100.0)
MCV: 85.4 fL (ref 78.0–100.0)
MCV: 86.1 fL (ref 78.0–100.0)
Platelets: 164 10*3/uL (ref 150–400)
Platelets: 154 10*3/uL (ref 150–400)
Platelets: 198 10*3/uL (ref 150–400)
Platelets: 182 10*3/uL (ref 150–400)
RBC: 2.73 MIL/uL — ABNORMAL LOW (ref 4.22–5.81)
RBC: 2.29 MIL/uL — ABNORMAL LOW (ref 4.22–5.81)
RBC: 2.88 MIL/uL — ABNORMAL LOW (ref 4.22–5.81)
RBC: 2.8 MIL/uL — ABNORMAL LOW (ref 4.22–5.81)
RDW: 15.3 % (ref 11.5–15.5)
RDW: 15.7 % — ABNORMAL HIGH (ref 11.5–15.5)
RDW: 15.9 % — ABNORMAL HIGH (ref 11.5–15.5)
RDW: 15.9 % — ABNORMAL HIGH (ref 11.5–15.5)
WBC: 13.6 10*3/uL — ABNORMAL HIGH (ref 4.0–10.5)
WBC: 10.3 10*3/uL (ref 4.0–10.5)
WBC: 11.3 10*3/uL — ABNORMAL HIGH (ref 4.0–10.5)
WBC: 7.7 10*3/uL (ref 4.0–10.5)

## 2014-03-17 LAB — PREPARE FRESH FROZEN PLASMA: Unit division: 0

## 2014-03-17 LAB — BASIC METABOLIC PANEL
Anion gap: 4 — ABNORMAL LOW (ref 5–15)
BUN: 20 mg/dL (ref 6–23)
CO2: 21 mmol/L (ref 19–32)
Calcium: 7.1 mg/dL — ABNORMAL LOW (ref 8.4–10.5)
Chloride: 116 mmol/L — ABNORMAL HIGH (ref 96–112)
Creatinine, Ser: 0.93 mg/dL (ref 0.50–1.35)
GFR calc Af Amer: >90 mL/min (ref >90)
GFR calc non Af Amer: >90 mL/min (ref >90)
Glucose, Bld: 111 mg/dL — ABNORMAL HIGH (ref 70–99)
Potassium: 3.8 mmol/L (ref 3.5–5.1)
Sodium: 141 mmol/L (ref 135–145)

## 2014-03-17 LAB — TROPONIN I
Troponin I: 0.35 ng/mL — ABNORMAL HIGH (ref <0.031)
Troponin I: 0.39 ng/mL — ABNORMAL HIGH (ref <0.031)

## 2014-03-17 LAB — CLOTEST (H. PYLORI), BIOPSY: Helicobacter screen: NEGATIVE

## 2014-03-17 LAB — PHOSPHORUS: Phosphorus: 4.4 mg/dL (ref 2.3–4.6)

## 2014-03-17 LAB — MAGNESIUM: Magnesium: 1.5 mg/dL (ref 1.5–2.5)

## 2014-03-17 LAB — ABO/RH: ABO/RH(D): A POS

## 2014-03-17 LAB — PROTIME-INR
INR: 1.34 (ref 0.00–1.49)
Prothrombin Time: 16.7 seconds — ABNORMAL HIGH (ref 11.6–15.2)

## 2014-03-17 LAB — CORTISOL: Cortisol, Plasma: 57.5 ug/dL

## 2014-03-17 LAB — PREPARE RBC (CROSSMATCH)

## 2014-03-17 MED ORDER — HYDROMORPHONE HCL PF 10 MG/ML IJ SOLN: 2.0000 mg/h | INTRAMUSCULAR | Status: DC

## 2014-03-17 MED ORDER — NICOTINE 21 MG/24HR TD PT24
21.0000 mg | MEDICATED_PATCH | Freq: Every day | TRANSDERMAL | Status: DC
  Administered 2014-03-17 – 2014-03-22 (×6): 21 mg via TRANSDERMAL
  Filled 2014-03-17 (×6): qty 1

## 2014-03-17 MED ORDER — SODIUM CHLORIDE 0.9 % IV SOLN
Freq: Once | INTRAVENOUS | Status: AC
  Administered 2014-03-17: 10 mL/h via INTRAVENOUS

## 2014-03-17 MED ORDER — PNEUMOCOCCAL VAC POLYVALENT 25 MCG/0.5ML IJ INJ
0.5000 mL | INJECTION | INTRAMUSCULAR | Status: AC
  Administered 2014-03-21: 0.5 mL via INTRAMUSCULAR
  Filled 2014-03-17: qty 0.5

## 2014-03-17 NOTE — Progress Notes (Signed)
  RN callinga gain   1.  - stil no active bleed. Hgb stable  Recent Labs Lab 03/16/14 2119 03/17/14 0200 03/17/14 0730 03/17/14 1430 03/17/14 2000  HGB 11.6* 8.2* 7.0* 8.5* 8.4*    2. Patient wanting nicotine patch - on dilaudid gtt watching football game and saying is jittery  Rx nicotine patch x 21 days  Dr. Kalman Shan, M.D., [facility].C.P Pulmonary and Critical Care Medicine Staff Physician [facility] [place] [facility] Pager: [phone], If no answer or between  15:00h - 7:00h: call [phone]  03/17/2014 8:49 PM

## 2014-03-17 NOTE — Progress Notes (Signed)
   On diprivan 15 - and levophed 10 and dilaudid3 - stable range of dosing No active GI bleed visible per RN  RN concerned about HR 50s and some high 40s - sinus  A Likely sinus bradycardia - due to normal variation  P Monitor Next cbc at 8pm (last Prbc earlier today)   Dr. Kalman Shan, M.D., [facility].C.P Pulmonary and Critical Care Medicine Staff Physician [facility] [place] [facility] Pager: [phone], If no answer or between  15:00h - 7:00h: call [phone]  03/17/2014 6:21 PM

## 2014-03-17 NOTE — Progress Notes (Signed)
CRITICAL VALUE ALERT  Critical value received:  Aerobic Blood Culture bottle growing Gram + Cocci in chains  Date of notification:  03/17/2014  Time of notification:  2116  Critical value read back:Yes.    Nurse who received alert:  Jasper Riling RN  MD notified (1st page):  Dr. Marchelle Gearing  Time of first page:  Called to elink [phone]  MD notified (2nd page):  Time of second page:  Responding MD:  n/a  Time MD responded:  n/a

## 2014-03-17 NOTE — Progress Notes (Signed)
Utilization review completed.  

## 2014-03-17 NOTE — Op Note (Signed)
[facility] [address] [place], [address]   ENDOSCOPY PROCEDURE REPORT  PATIENT: Shane Stone, Shane Stone  MR#: [id number] BIRTHDATE: [birth year] , 29  yrs. old GENDER: male ENDOSCOPIST: Hilarie Fredrickson, [facility] REFERRED BY:  Leslye Peer, M.D. PROCEDURE DATE:  03/16/2014 PROCEDURE:  EGD w/ control of bleeding and EGD w/ biopsy for H.pylori ASA CLASS:     Class II INDICATIONS:  hematemesis and melena.  Shock MEDICATIONS: Per nursing report. Patient intubated TOPICAL ANESTHETIC: none  DESCRIPTION OF PROCEDURE: After the risks benefits and alternatives of the procedure were thoroughly explained, informed consent was obtained.  The Pentax Gastroscope Therapeutic [id number] endoscope was introduced through the mouth and advanced to the second portion of the duodenum , Without limitations.  The instrument was slowly withdrawn as the mucosa was fully examined.  EXAM:The patient was intubated.  The esophagus was filled with blood and clots.  The proximal stomach was filled with blood and clots. The distal stomach had significant blood and clots.  The duodenal bulb revealed a giant ulcer essentially replacing the bulb with large adherent clot and bleeding.  The clot and area of bleeding were injected with epinephrine multiple times.  Beneath the clot was a nonbleeding vessel.  This was secured with Endo Clip times one.  (Misfire -3).  The active bleeding was then treated with 10 [place] BiCAP probe with complete hemostasis achieved.  CLO biopsy taken from the gastric antrum.  Retroflexed views revealed no abnormalities.     The scope was then withdrawn from the patient and the procedure completed. COMPLICATIONS: There were no immediate complications.  ENDOSCOPIC IMPRESSION: 1. 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 Status post successful endoscopic hemostatic therapy as described. Large portion of the upper of obscured by blood  and clots  RECOMMENDATIONS: 1. Continue IV PPI infusion 2. Transfuse to maintain hemoglobin above 8 or as hemodynamically indicated 3. This ulcer has a high risk for rebleed. This session of endoscopic therapy deemed adequate. Should rebleeding occur may need interventional radiology and/or surgery. Dr. Dwain Sarna and Dr. Fredia Sorrow personally notified  and are aware. 4. Avoid NSAIDs. Treat for H. pylori if CLOtest positive. If negative, check H. pylori antibody.  REPEAT EXAM:  eSigned:  Hilarie Fredrickson, [facility] 03/17/2014 11:25 AM    AO:[id number] Kavin Leech, MD, Tommie Raymond, MD and Emelia Loron, MD

## 2014-03-17 NOTE — Progress Notes (Signed)
66ml Dilaudid wasted and rinsed down sink from expired bag, witnessed by second RN--Allison Haggard. Holland Falling RN

## 2014-03-17 NOTE — Progress Notes (Addendum)
[facility] Progress Note 1 Day Post-Op  Subjective: Patient does not appear to be acutely bleeding.  No bloody bowel movements, and no or minimal oral gastric bloody drainage.  No bright red drainage.  Objective: Vital signs in last 24 hours: Temp:  [97.7 F (36.5 C)-99.3 F (37.4 C)] 98.7 F (37.1 C) (01/24 0700) Pulse Rate:  [67-173] 71 (01/24 0700) Resp:  [11-29] 16 (01/24 0700) BP: (62-162)/(44-97) 83/47 mmHg (01/24 0700) SpO2:  [95 %-100 %] 100 % (01/24 0700) FiO2 (%):  [30 %-60 %] 30 % (01/24 0344) Weight:  [62 kg (136 lb 11 oz)-65.5 kg (144 lb 6.4 oz)] 65.5 kg (144 lb 6.4 oz) (01/24 0500) Last BM Date: 03/16/14  Intake/Output from previous day: 01/23 0701 - 01/24 0700 In: 3901.1 [I.V.:1701.1; Blood:1340; NG/GT:60; IV Piggyback:800] Out: 2040 [Urine:790; Emesis/NG output:1250] Intake/Output this shift:    General: No acute distress.  Lungs: Clear.  Sats are good and it seems that he could be extubated.  Abd: Hypoactive bowel sounds.  Tender a bit in the right upper quadrant.  No diffuse peritonitis.    Extremities: No changes  Neuro: Intact on the ventilator.  Seems alert and oriented.  Lab Results:  (wbc:2,hgb:2,hct:2,plt:2) BMET  Recent Labs  03/16/14 1830 03/17/14 0530  NA 144 141  K 4.1 3.8  CL 123* 116*  CO2 18* 21  GLUCOSE 181* 111*  BUN 21 20  CREATININE 1.01 0.93  CALCIUM 6.4* 7.1*   PT/INR No results for input(s): LABPROT, INR in the last 72 hours. ABG  Recent Labs  03/16/14 2250  PHART 7.341*  HCO3 15.8*    Studies/Results: Dg Chest Port 1 View  03/16/2014   CLINICAL DATA:  Central line placement, GI bleed  EXAM: PORTABLE CHEST - 1 VIEW  COMPARISON:  03/16/2014  FINDINGS: Cardiomediastinal silhouette is stable. Endotracheal tube in place with tip 4.6 cm above the carina. NG tube coiled within stomach with tip in distal stomach. There is right IJ central line with tip in distal SVC. No pneumothorax. No acute infiltrate or  pulmonary edema.  IMPRESSION: NG tube and endotracheal tube in place. Right IJ central line in place. No pneumothorax.   Electronically Signed   By: Natasha Mead M.D.   On: 03/16/2014 19:24    Anti-infectives: Anti-infectives    Start     Dose/Rate Route Frequency Ordered Stop   03/16/14 2000  ciprofloxacin (CIPRO) IVPB 400 mg     400 mg200 mL/hr over 60 Minutes Intravenous Every 12 hours 03/16/14 1811     03/16/14 2000  vancomycin (VANCOCIN) IVPB 1000 mg/200 mL premix     1,000 mg200 mL/hr over 60 Minutes Intravenous Every 8 hours 03/16/14 1811     03/16/14 1900  erythromycin 500 mg in sodium chloride 0.9 % 100 mL IVPB  Status:  Discontinued     500 mg100 mL/hr over 60 Minutes Intravenous  Once 03/16/14 1815 03/16/14 2009   03/16/14 1800  metroNIDAZOLE (FLAGYL) IVPB 500 mg     500 mg100 mL/hr over 60 Minutes Intravenous Every 8 hours 03/16/14 1742        Assessment/Plan: s/p Procedure(s): ESOPHAGOGASTRODUODENOSCOPY (EGD) Would work towards extubation.   Hemoglobin drop from 11.4 to 8.4 probably from erroneous 11.4 report.  Awaiting AM labs.  LOS: 1 day   Marta Lamas. Gae Bon, MD, FACS [phone] [facility] 03/17/2014   Repeat HGB this AM 7.0.  Will give one unit of blood

## 2014-03-17 NOTE — Progress Notes (Signed)
[facility] Progress Note  Subjective:  Patient is on levophed, propofol, and dilaudid gtt but remains wide awake.  In bed brushing his hair and writes stuff down to ask questions.  Has diffuse abdominal pain, likely at least somwhat related to his Crohn's disease.  Last BM was yesterday around the time of his procedure.  Objective:  Vital signs in last 24 hours: Temp:  [97.7 F (36.5 C)-99.3 F (37.4 C)] 98.1 F (36.7 C) (01/24 0950) Pulse Rate:  [67-173] 67 (01/24 0935) Resp:  [11-29] 15 (01/24 0935) BP: (62-162)/(44-97) 90/46 mmHg (01/24 0950) SpO2:  [95 %-100 %] 100 % (01/24 0935) FiO2 (%):  [30 %-60 %] 30 % (01/24 0935) Weight:  [136 lb 11 oz (62 kg)-144 lb 6.4 oz (65.5 kg)] 144 lb 6.4 oz (65.5 kg) (01/24 0500) Last BM Date: 03/16/14 General:  Alert, Well-developed, in NAD; wide awake despite sedative agents. Heart:  Regular rate and rhythm; no murmurs Pulm:  CTAB.  No W/R/R. Abdomen:  Soft, non-distended.  BS present.  Diffuse TTP. Extremities:  Without edema. Neurologic:  Alert and oriented;  grossly normal neurologically.  Intake/Output from previous day: 01/23 0701 - 01/24 0700 In: 3901.1 [I.V.:1701.1; Blood:1340; NG/GT:60; IV Piggyback:800] Out: 2040 [Urine:790; Emesis/NG output:1250] Intake/Output this shift:    Lab Results:  Recent Labs  03/16/14 2119 03/17/14 0200 03/17/14 0730  WBC 35.0* 13.6* 10.3  HGB 11.6* 8.2* 7.0*  HCT 33.9* 23.2* 19.4*  PLT 297 164 154   BMET  Recent Labs  03/16/14 1830 03/17/14 0530  NA 144 141  K 4.1 3.8  CL 123* 116*  CO2 18* 21  GLUCOSE 181* 111*  BUN 21 20  CREATININE 1.01 0.93  CALCIUM 6.4* 7.1*   LFT  Recent Labs  03/16/14 1830  PROT <3.0*  ALBUMIN 1.2*  AST 20  ALT 12  ALKPHOS 24*  BILITOT 0.3   PT/INR  Recent Labs  03/17/14 0920  LABPROT 16.7*  INR 1.34   Dg Chest Port 1 View  03/16/2014   CLINICAL DATA:  Central line placement, GI bleed  EXAM: PORTABLE CHEST - 1 VIEW   COMPARISON:  03/16/2014  FINDINGS: Cardiomediastinal silhouette is stable. Endotracheal tube in place with tip 4.6 cm above the carina. NG tube coiled within stomach with tip in distal stomach. There is right IJ central line with tip in distal SVC. No pneumothorax. No acute infiltrate or pulmonary edema.  IMPRESSION: NG tube and endotracheal tube in place. Right IJ central line in place. No pneumothorax.   Electronically Signed   By: Natasha Mead M.D.   On: 03/16/2014 19:24   Assessment / Plan: 1. Giant duodenal bulb ulcer with massive bleeding. Large pigmented protuberance injected with epinephrine and subsequently clipped. The second visible vessel with vigorous active bleeding was injected with epinephrine followed by BiCAP coaptation. Hemostasis achieved. Limited views of the esophagus and stomach due to massive amounts of blood and blood clots. CLO biopsy taken and pending, but suspect NSAID related. 2. Acute upper GI bleed with hypotension:  Still intubated, on sedation, and pressors.  But remains wide awake despite sedative agents. 3.  Acute on chronic anemia:  Hgb 7.0 grams this AM. 4. History of Crohn's ileocolitis complicated by abscess status post transient percutaneous drainage September 2015 5. History of medical noncompliance   RECOMMENDATIONS 1. Transfuse to hemoglobin greater than 8 2. Continue IV Protonix drip 3. This ulcer has a high risk of rebleeding based on the size. Thus, Gen. surgery (  Dr. Dwain Sarna) and interventional radiology (Dr. Fredia Sorrow) made aware this patient.  If he re-bleeds then next step would likely be vessel embolization by IR.    LOS: 1 day   ZEHR, JESSICA D.  03/17/2014, 10:13 AM  Pager number [phone]  GI ATTENDING  Interval history data reviewed. Patient seen and examined. Agree with progress note as above. For the time being, bleeding seems to have stopped. Hopefully he will need nothing in addition to recent endoscopic therapy. Continue with PPI and  close observation. Agree with transfusion to keep hemoglobin above 8.  Wilhemina Bonito. Eda Keys., M.D. [facility] Division of Gastroenterology

## 2014-03-17 NOTE — Progress Notes (Signed)
PULMONARY / CRITICAL CARE MEDICINE   Name: Shane Stone MRN: [id number] DOB: [birth year]    ADMISSION DATE:  03/16/2014  CHIEF COMPLAINT:  Abdominal pain, GIB  INITIAL PRESENTATION: 29 yo man, hx Crohn's, narcotics use, admitted with massive GIB, suspected PUD / ulcer. Admitted for stabilization 1/23 to facilitate EGD.   STUDIES:  1/23 EGD> "giant" duodenoal bulb ulcer with massive bleeding clipped and epinephrine injected; hemostasis achieved but high risk of bleeding  SIGNIFICANT EVENTS: 1/23 admitted, endoscopy as above, surgery consult 1/23 CT abdomen> large clot and fluid collectsion in stomach, small bowel enhancement consistent with crohns, improved from prior, fluid collection in abdominal wall from prior abscess also improved  SUBJECTIVE:  Complained of abdominal pain overnight, had dilaudid gtt started, Hgb 7 this AM  VITAL SIGNS: Temp:  [97.7 F (36.5 C)-99.3 F (37.4 C)] 98.7 F (37.1 C) (01/24 0700) Pulse Rate:  [67-173] 71 (01/24 0700) Resp:  [11-29] 16 (01/24 0700) BP: (62-162)/(44-97) 83/47 mmHg (01/24 0700) SpO2:  [95 %-100 %] 100 % (01/24 0700) FiO2 (%):  [30 %-60 %] 30 % (01/24 0344) Weight:  [62 kg (136 lb 11 oz)-65.5 kg (144 lb 6.4 oz)] 65.5 kg (144 lb 6.4 oz) (01/24 0500) HEMODYNAMICS:   VENTILATOR SETTINGS: Vent Mode:  [-] PRVC FiO2 (%):  [30 %-60 %] 30 % Set Rate:  [16 bmp] 16 bmp Vt Set:  [550 mL] 550 mL PEEP:  [5 cmH20] 5 cmH20 Plateau Pressure:  [14 cmH20-18 cmH20] 14 cmH20 INTAKE / OUTPUT:  Intake/Output Summary (Last 24 hours) at 03/17/14 0916 Last data filed at 03/17/14 0700  Gross per 24 hour  Intake 3901.09 ml  Output   2040 ml  Net 1861.09 ml    PHYSICAL EXAMINATION: General:  Pale, chronically ill appearing on vent HEENT: NCAT, ETT in place, OG PULM: CTA B CV: RRR, no mgr AB: BS+, mildly tender epigastrum Ext: SCD in place Neuro: nods head to voice, follows commands  LABS:  CBC  Recent Labs Lab 03/16/14 2119  03/17/14 0200 03/17/14 0730  WBC 35.0* 13.6* 10.3  HGB 11.6* 8.2* 7.0*  HCT 33.9* 23.2* 19.4*  PLT 297 164 154   Coag's No results for input(s): APTT, INR in the last 168 hours. BMET  Recent Labs Lab 03/16/14 1830 03/17/14 0530  NA 144 141  K 4.1 3.8  CL 123* 116*  CO2 18* 21  BUN 21 20  CREATININE 1.01 0.93  GLUCOSE 181* 111*   Electrolytes  Recent Labs Lab 03/16/14 1830 03/17/14 0530  CALCIUM 6.4* 7.1*  MG 1.4* 1.5  PHOS 3.8 4.4   Sepsis Markers  Recent Labs Lab 03/16/14 1830  LATICACIDVEN 3.9*  PROCALCITON 0.56   ABG  Recent Labs Lab 03/16/14 2250  PHART 7.341*  PCO2ART 30.1*  PO2ART 328.0*   Liver Enzymes  Recent Labs Lab 03/16/14 1830  AST 20  ALT 12  ALKPHOS 24*  BILITOT 0.3  ALBUMIN 1.2*   Cardiac Enzymes  Recent Labs Lab 03/16/14 1830 03/16/14 2354 03/17/14 0530  TROPONINI 0.19* 0.35* 0.39*   Glucose  Recent Labs Lab 03/16/14 1716  GLUCAP 158*    Imaging Dg Chest Port 1 View  03/16/2014   CLINICAL DATA:  Central line placement, GI bleed  EXAM: PORTABLE CHEST - 1 VIEW  COMPARISON:  03/16/2014  FINDINGS: Cardiomediastinal silhouette is stable. Endotracheal tube in place with tip 4.6 cm above the carina. NG tube coiled within stomach with tip in distal stomach. There is right IJ central line with  tip in distal SVC. No pneumothorax. No acute infiltrate or pulmonary edema.  IMPRESSION: NG tube and endotracheal tube in place. Right IJ central line in place. No pneumothorax.   Electronically Signed   By: Natasha Mead M.D.   On: 03/16/2014 19:24     ASSESSMENT / PLAN:  GASTROINTESTINAL A:  Acute GIB due to large duodenal ulcer, clipped, injected 1/24;  High risk re-bleed per GI Crohn's disease, not currently on rx. No clear evidence for a flare beyond his abdominal pain.  Hx intra-abd abscesses s/p drainage, last 10/2013 > improved per 1/23 CT P:   - continue pantoprazole gtt - f/u with GI > tube feedings? - crohn's treatment  per GI - zofran prn - follow freq CBC and give PRBC for goal Hgb > 8.0  PULMONARY OETT A: Acute respiratory failure > aspiration risk, high sedation needs, frequent vomiting P:   - full vent support 1/24 - if no evidence of further bleeding 1/25 extubate  CARDIOVASCULAR CVL 1/23 >>  A: Hemorrhagic shock resolved P:  - tele - monitor hemodynamics  RENAL A:  No acute issues P:   - Monitor BMET and UOP - Replace electrolytes as needed   HEMATOLOGIC A:  Acute blood loss anemia P:  - PRBC replacement (again 1 U now), goal Hgb > 8.0 - follow CBC q shift at this point  INFECTIOUS A:  Possible R abdominal cellulitis / subcutaneous abscess Possible intra-abd infection P:   BCx2 1/23 >>  UC 1/23 >>   vanco 1/23 >> d/c 1/25 if cultures neg cipro 1/23 >>  Flagyl 1/23 >>   Empiric abx started 1/23 as above, given improved fluid collection, could d/c 1/25 if stable  ENDOCRINE A:  Hyperglycemia P:  - follow glucose on BMP's  NEUROLOGIC A:  Lethargy due to hypotension P:   RASS goal: 0 - follow MS with resuscitation   FAMILY  - Updates: no family at bedside  - Inter-disciplinary family meet or Palliative Care meeting due by:  03/23/14    TODAY'S SUMMARY: Acute GIB with associated shock, critically ill with active massive hematemesis. Currently not actively bleeding but had large duodenal ulcer, high risk of re-bleed.  Maintain on vent support today, likely extubation 1/25 if stable. Monitor CBC. Tube feedings per GI   My cc time today 45 minutes  Heber [place], MD [facility] Pager: [phone] Cell: [phone] If no response, call [phone]

## 2014-03-18 ENCOUNTER — Encounter (HOSPITAL_COMMUNITY): Payer: Self-pay | Admitting: Internal Medicine

## 2014-03-18 LAB — CBC WITH DIFFERENTIAL/PLATELET
Basophils Absolute: 0 10*3/uL (ref 0.0–0.1)
Basophils Relative: 0 % (ref 0–1)
Eosinophils Absolute: 0 10*3/uL (ref 0.0–0.7)
Eosinophils Relative: 0 % (ref 0–5)
HCT: 23.6 % — ABNORMAL LOW (ref 39.0–52.0)
Hemoglobin: 8.2 g/dL — ABNORMAL LOW (ref 13.0–17.0)
Lymphocytes Relative: 37 % (ref 12–46)
Lymphs Abs: 2.8 10*3/uL (ref 0.7–4.0)
MCH: 29.9 pg (ref 26.0–34.0)
MCHC: 34.7 g/dL (ref 30.0–36.0)
MCV: 86.1 fL (ref 78.0–100.0)
Monocytes Absolute: 0.9 10*3/uL (ref 0.1–1.0)
Monocytes Relative: 13 % — ABNORMAL HIGH (ref 3–12)
Neutro Abs: 3.8 10*3/uL (ref 1.7–7.7)
Neutrophils Relative %: 50 % (ref 43–77)
Platelets: 178 10*3/uL (ref 150–400)
RBC: 2.74 MIL/uL — ABNORMAL LOW (ref 4.22–5.81)
RDW: 16.4 % — ABNORMAL HIGH (ref 11.5–15.5)
WBC: 7.6 10*3/uL (ref 4.0–10.5)

## 2014-03-18 LAB — CULTURE, BLOOD (ROUTINE X 2)

## 2014-03-18 LAB — CBC
HCT: 23.8 % — ABNORMAL LOW (ref 39.0–52.0)
Hemoglobin: 8.3 g/dL — ABNORMAL LOW (ref 13.0–17.0)
MCH: 30.5 pg (ref 26.0–34.0)
MCHC: 34.9 g/dL (ref 30.0–36.0)
MCV: 87.5 fL (ref 78.0–100.0)
Platelets: 184 10*3/uL (ref 150–400)
RBC: 2.72 MIL/uL — ABNORMAL LOW (ref 4.22–5.81)
RDW: 16.3 % — ABNORMAL HIGH (ref 11.5–15.5)
WBC: 7.6 10*3/uL (ref 4.0–10.5)

## 2014-03-18 LAB — BASIC METABOLIC PANEL
Anion gap: 5 (ref 5–15)
BUN: 20 mg/dL (ref 6–23)
CO2: 23 mmol/L (ref 19–32)
Calcium: 7.4 mg/dL — ABNORMAL LOW (ref 8.4–10.5)
Chloride: 115 mmol/L — ABNORMAL HIGH (ref 96–112)
Creatinine, Ser: 0.86 mg/dL (ref 0.50–1.35)
GFR calc Af Amer: >90 mL/min (ref >90)
GFR calc non Af Amer: >90 mL/min (ref >90)
Glucose, Bld: 120 mg/dL — ABNORMAL HIGH (ref 70–99)
Potassium: 3.7 mmol/L (ref 3.5–5.1)
Sodium: 143 mmol/L (ref 135–145)

## 2014-03-18 LAB — BLOOD PRODUCT ORDER (VERBAL) VERIFICATION

## 2014-03-18 MED ORDER — CETYLPYRIDINIUM CHLORIDE 0.05 % MT LIQD
7.0000 mL | Freq: Two times a day (BID) | OROMUCOSAL | Status: DC
  Administered 2014-03-18 (×2): 7 mL via OROMUCOSAL

## 2014-03-18 MED ORDER — EPINEPHRINE HCL 0.1 MG/ML IJ SOSY
PREFILLED_SYRINGE | INTRAMUSCULAR | Status: AC
  Filled 2014-03-18: qty 10

## 2014-03-18 MED ORDER — HYDROMORPHONE HCL 1 MG/ML IJ SOLN
1.0000 mg | INTRAMUSCULAR | Status: DC | PRN
  Administered 2014-03-18 – 2014-03-21 (×30): 1 mg via INTRAVENOUS
  Filled 2014-03-18 (×29): qty 1

## 2014-03-18 MED ORDER — PANTOPRAZOLE SODIUM 40 MG IV SOLR
40.0000 mg | Freq: Two times a day (BID) | INTRAVENOUS | Status: DC
  Administered 2014-03-19: 40 mg via INTRAVENOUS
  Filled 2014-03-18 (×3): qty 40

## 2014-03-18 MED ORDER — CHLORHEXIDINE GLUCONATE 0.12 % MT SOLN
15.0000 mL | Freq: Two times a day (BID) | OROMUCOSAL | Status: DC
  Administered 2014-03-18 – 2014-03-19 (×2): 15 mL via OROMUCOSAL
  Filled 2014-03-18 (×2): qty 15

## 2014-03-18 NOTE — Progress Notes (Signed)
PULMONARY / CRITICAL CARE MEDICINE   Name: Shane Stone MRN: [id number] DOB: 1985/09/09    ADMISSION DATE:  03/16/2014  CHIEF COMPLAINT:  Abdominal pain, GIB  INITIAL PRESENTATION: 29 yo man, hx Crohn's, narcotics use, admitted with massive GIB, suspected PUD / ulcer. Admitted for stabilization 1/23 to facilitate EGD.   STUDIES:  1/23 EGD> "giant" duodenoal bulb ulcer with massive bleeding clipped and epinephrine injected; hemostasis achieved but high risk of bleeding  SIGNIFICANT EVENTS: 1/23 admitted, endoscopy as above, surgery consult 1/23 CT abdomen> large clot and fluid collectsion in stomach, small bowel enhancement consistent with crohns, improved from prior, fluid collection in abdominal wall from prior abscess also improved 1/25 Successfully extubated  SUBJECTIVE:  Passed SBT, extubated and tolerating  VITAL SIGNS: Temp:  [98.3 F (36.8 C)-99.5 F (37.5 C)] 98.9 F (37.2 C) (01/25 1219) Pulse Rate:  [52-96] 81 (01/25 1315) Resp:  [8-26] 12 (01/25 1315) BP: (93-168)/(41-74) 124/74 mmHg (01/25 1315) SpO2:  [99 %-100 %] 100 % (01/25 1315) FiO2 (%):  [30 %] 30 % (01/25 0931) Weight:  [64.7 kg (142 lb 10.2 oz)] 64.7 kg (142 lb 10.2 oz) (01/25 0444) HEMODYNAMICS:   VENTILATOR SETTINGS: Vent Mode:  [-] PSV;CPAP FiO2 (%):  [30 %] 30 % Set Rate:  [16 bmp] 16 bmp Vt Set:  [550 mL] 550 mL PEEP:  [5 cmH20] 5 cmH20 Pressure Support:  [5 cmH20] 5 cmH20 Plateau Pressure:  [13 cmH20-14 cmH20] 13 cmH20 INTAKE / OUTPUT:  Intake/Output Summary (Last 24 hours) at 03/18/14 1407 Last data filed at 03/18/14 1300  Gross per 24 hour  Intake 2614.76 ml  Output   4835 ml  Net -2220.24 ml    PHYSICAL EXAMINATION: General:  NAD HEENT: WNL PULM: Clear CV: Reg, no M AB: + BS, soft Ext: no edema Neuro: intact  LABS: I have reviewed all of today's lab results. Relevant abnormalities are discussed in the A/P section   ASSESSMENT / PLAN:  GASTROINTESTINAL A:  Acute  GIB Large duodenal ulcer, clipped, injected 1/24 - High risk re-bleed per GI Crohn's disease, not currently on rx.  Hx intra-abd abscesses s/p drainage, last 10/2013 > improved per 1/23 CT P:   GI medicine and surgery following  PULMONARY A: Acute respiratory failure, resolved P:   Monitor in ICU post extubation Supp O2 PRN  CARDIOVASCULAR CVL 1/23 >>  A: Hemorrhagic shock, resolved P:  Cont tele  RENAL A:   No acute issues P:   Monitor BMET intermittently Monitor I/Os Correct electrolytes as indicated  HEMATOLOGIC A:   Acute blood loss anemia P:  DVT px: SQ heparin Monitor CBC intermittently Transfuse per usual ICU guidelines  INFECTIOUS A:   Doubt R abdominal cellulitis / subcutaneous abscess Doubt intra-abd infection P:   BCx2 1/23 >>  UC 1/23 >>   vanco 1/23 >> d/c 1/25 if cultures neg cipro 1/23 >> 1/25 Flagyl 1/23 >> 1/25  ENDOCRINE A:  Hyperglycemia, resolved P:    NEUROLOGIC A:   Lethargy, resolved  Chronic pain P:   RASS goal: 0  PRN hydromorphone post extubation   FAMILY  - Updates: no family at bedside    Billy Fischer, MD ; [facility] service Mobile [phone].  After 5:30 PM or weekends, call [phone]

## 2014-03-18 NOTE — Progress Notes (Signed)
Daily Rounding Note  03/18/2014, 10:03 AM  LOS: 2 days   SUBJECTIVE:       Getting prn Fentanyl for pain, after the Fentanyl drip was discontinued. Now extubated  OBJECTIVE:         Vital signs in last 24 hours:    Temp:  [98 F (36.7 C)-99.5 F (37.5 C)] 98.3 F (36.8 C) (01/25 0748) Pulse Rate:  [52-96] 82 (01/25 0950) Resp:  [13-26] 16 (01/25 0950) BP: (87-168)/(41-73) 168/68 mmHg (01/25 0950) SpO2:  [99 %-100 %] 99 % (01/25 0950) FiO2 (%):  [30 %] 30 % (01/25 0931) Weight:  [142 lb 10.2 oz (64.7 kg)] 142 lb 10.2 oz (64.7 kg) (01/25 0444) Last BM Date: 03/16/14 Filed Weights   03/16/14 1842 03/17/14 0500 03/18/14 0444  Weight: 136 lb 11 oz (62 kg) 144 lb 6.4 oz (65.5 kg) 142 lb 10.2 oz (64.7 kg)   General: pale, comfortable,   Heart: RRR Chest: clear bil Abdomen: soft,   Extremities: no CCE Neuro/Psych:  Cooperative, oriented x 3.  No gross deficits.   Intake/Output from previous day: 01/24 0701 - 01/25 0700 In: 3668.3 [I.V.:1953.3; Blood:355; NG/GT:60; IV Piggyback:1300] Out: 4435 [Urine:4435]  Intake/Output this shift: Total I/O In: 180.8 [I.V.:180.8] Out: 800 [Urine:800]  Lab Results:  Recent Labs  03/17/14 2000 03/18/14 0306 03/18/14 0800  WBC 7.7 7.6 7.6  HGB 8.4* 8.2* 8.3*  HCT 24.1* 23.6* 23.8*  PLT 182 178 184   BMET  Recent Labs  03/16/14 1830 03/17/14 0530 03/18/14 0306  NA 144 141 143  K 4.1 3.8 3.7  CL 123* 116* 115*  CO2 18* 21 23  GLUCOSE 181* 111* 120*  BUN 21 20 20   CREATININE 1.01 0.93 0.86  CALCIUM 6.4* 7.1* 7.4*   LFT  Recent Labs  03/16/14 1830  PROT <3.0*  ALBUMIN 1.2*  AST 20  ALT 12  ALKPHOS 24*  BILITOT 0.3   PT/INR  Recent Labs  03/17/14 0920  LABPROT 16.7*  INR 1.34   Hepatitis Panel No results for input(s): HEPBSAG, HCVAB, HEPAIGM, HEPBIGM in the last 72 hours.  Studies/Results: Dg Chest Port 1 View  03/16/2014   CLINICAL DATA:   Central line placement, GI bleed  EXAM: PORTABLE CHEST - 1 VIEW  COMPARISON:  03/16/2014  FINDINGS: Cardiomediastinal silhouette is stable. Endotracheal tube in place with tip 4.6 cm above the carina. NG tube coiled within stomach with tip in distal stomach. There is right IJ central line with tip in distal SVC. No pneumothorax. No acute infiltrate or pulmonary edema.  IMPRESSION: NG tube and endotracheal tube in place. Right IJ central line in place. No pneumothorax.   Electronically Signed   By: Natasha Mead M.D.   On: 03/16/2014 19:24   Scheduled Meds: . antiseptic oral rinse  7 mL Mouth Rinse QID  . chlorhexidine  15 mL Mouth Rinse BID  . nicotine  21 mg Transdermal Daily  . pneumococcal 23 valent vaccine  0.5 mL Intramuscular Tomorrow-1000  . sodium chloride  10-40 mL Intracatheter Q12H   Continuous Infusions: . dextrose 5 % and 0.45% NaCl 50 mL/hr at 03/18/14 0900  . norepinephrine (LEVOPHED) Adult infusion 10.027 mcg/min (03/18/14 0900)  . pantoprozole (PROTONIX) infusion 8 mg/hr (03/18/14 0900)   PRN Meds:.HYDROmorphone (DILAUDID) injection, ondansetron (ZOFRAN) IV, sodium chloride  ASSESMENT:   *  Upper GI bleed due to giant duodenal ulcer with VV x 2.  Treated at EGD 1/23 with  epinephrine, endoclipping.  Esophagus and stomach obscured by fresh blood.  On PPI drip. Pathology pending.  Taking 600 to 1200 mg Ibuprofen and percocets daily for abdominal pain.    *  Hx Crohns ileocolitis. S/p 10/2013 perc drainage for about 2 weeks of RLQ abscess.  Chronic abdominal pain.  Dr Jena Gauss Is GI PMD, pt does not follow up.  Hep B and C serologies negative. PPD negative 10/2013. Dr Kendell Bane delayed adding Crohn's meds in 10/2013 due to active infection/abscess so no Crohns meds PTA.  No colonoscopy for a few years. 05/2010 colonoscopy by Dr Linna Darner in [place]: normal colon.  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  Pt had recurrent localized swelling at fprmer  perc drain site, this "popped" about 2 weeks ago and drained bloody pus.   *  Acute on Chronic anemia.   Hgb stable.  S/P 8 PRBCs, 2 at [facility].   *  Mild coagulopathy.  S/p 4 FFP.    *  Socioeconomic hurdles include no job, no car, no driver to take him to appt, no $ to pay for MD visits or meds.    PLAN   *  72 hours of PPI drip ends tomorrow at 1759.  Start BID, IV vs PO after that. As to Crohns meds, given his limited resources, not clear where to start in terms of long term medical mgt.   *  Ok to have clears.    *  Perceive high potential for narcotics abuse in this pt.        Shane Stone  03/18/2014, 10:03 AM Pager: [phone]\  ________________________________________________________________________  [facility] MD note:  I personally examined the patient, reviewed the data and agree with the assessment and plan described above. Looks like he required 4 units FFP, 3 units blood yesterday.  No further Overt GI bleeding.  Should remain on clears for now.  Clo test pending, was taking very large amount of daily NSAIDs.  Rob Bunting, MD [facility] Pager [phone]

## 2014-03-18 NOTE — Progress Notes (Signed)
Flushed approx. 90 ml of Dilaudid down sink. Witnessed by Zeb Comfort, RN

## 2014-03-18 NOTE — Consult Note (Signed)
WOC consulted however at the time of my arrival to the patient's bedside surgery has just evaluated this patient and written orders for wound care. For this reason WOC will not consult.   Re consult if needed, will not follow at this time. Thanks  Landin Tallon [employer], CWOCN [phone])

## 2014-03-18 NOTE — Progress Notes (Signed)
Patient ID: Shane Stone, male   DOB: [birth year], 29 y.o.   MRN: [id number]     [facility]      [address] [place], [place] [address]    Phone: [phone] FAX: [phone]     Subjective: Pt is  Very frustrated because he cant communicate.  Wants ETT out.  H&H stable at present time.  No bloody BMs.  Awake on dilaudid/propofol  Objective:  Vital signs:  Filed Vitals:   03/18/14 0500 03/18/14 0600 03/18/14 0700 03/18/14 0748  BP: 101/52 110/69 111/67   Pulse: 71 57 58   Temp:    98.3 F (36.8 C)  TempSrc:    Axillary  Resp: _0 Height:      Weight:      SpO2: 100% 100% 100%     Last BM Date: 03/16/14  Intake/Output   Yesterday:  01/24 0701 - 01/25 0700 In: 1740.8 [I.V.:1953.3; Blood:355; NG/GT:60; IV Piggyback:1300] Out: 1448 [Urine:4435] This shift: I/O last 3 completed shifts: In: 7392.9 [I.V.:3477.9; JEHUD:1497; NG/GT:120; IV WYOVZCHYI:5027] Out: 7412 [Urine:5275; Emesis/NG output:300]    Physical Exam: General: Pt awake/alert/oriented x4 in no acute distress Abdomen: Soft.  Nondistended.  Mild ttp ruq.  No evidence of peritonitis.  No incarcerated hernias.   Problem List:   Active Problems:   GIB (gastrointestinal bleeding)   Duodenal ulcer hemorrhagic   Hemorrhagic shock    Results:   Labs: Results for orders placed or performed during the hospital encounter of 03/16/14 (from the past 48 hour(s))  Glucose, capillary     Status: Abnormal   Collection Time: 03/16/14  5:16 PM  Result Value Ref Range   Glucose-Capillary 158 (H) 70 - 99 mg/dL   Comment 1 Notify RN   MRSA PCR Screening     Status: None   Collection Time: 03/16/14  5:45 PM  Result Value Ref Range   MRSA by PCR NEGATIVE NEGATIVE    Comment:        The GeneXpert MRSA Assay (FDA approved for NASAL specimens only), is one component of a comprehensive MRSA colonization surveillance program. It is not intended to diagnose  MRSA infection nor to guide or monitor treatment for MRSA infections.   Comprehensive metabolic panel     Status: Abnormal   Collection Time: 03/16/14  6:30 PM  Result Value Ref Range   Sodium 144 135 - 145 mmol/L   Potassium 4.1 3.5 - 5.1 mmol/L   Chloride 123 (H) 96 - 112 mmol/L   CO2 18 (L) 19 - 32 mmol/L   Glucose, Bld 181 (H) 70 - 99 mg/dL   BUN 21 6 - 23 mg/dL   Creatinine, Ser 1.01 0.50 - 1.35 mg/dL   Calcium 6.4 (LL) 8.4 - 10.5 mg/dL    Comment: REPEATED TO VERIFY CRITICAL RESULT CALLED TO, READ BACK BY AND VERIFIED WITH: DANIELARN [id number] [place]    Total Protein <3.0 (L) 6.0 - 8.3 g/dL   Albumin 1.2 (L) 3.5 - 5.2 g/dL   AST 20 0 - 37 U/L   ALT 12 0 - 53 U/L   Alkaline Phosphatase 24 (L) 39 - 117 U/L   Total Bilirubin 0.3 0.3 - 1.2 mg/dL   GFR calc non Af Amer >90 >90 mL/min   GFR calc Af Amer >90 >90 mL/min    Comment: (NOTE) The eGFR has been calculated using the CKD EPI equation. This calculation has not been validated in all  clinical situations. eGFR's persistently <90 mL/min signify possible Chronic Kidney Disease.    Anion gap 3 (L) 5 - 15  Magnesium     Status: Abnormal   Collection Time: 03/16/14  6:30 PM  Result Value Ref Range   Magnesium 1.4 (L) 1.5 - 2.5 mg/dL  Phosphorus     Status: None   Collection Time: 03/16/14  6:30 PM  Result Value Ref Range   Phosphorus 3.8 2.3 - 4.6 mg/dL  Procalcitonin     Status: None   Collection Time: 03/16/14  6:30 PM  Result Value Ref Range   Procalcitonin 0.56 ng/mL    Comment:        Interpretation: PCT > 0.5 ng/mL and <= 2 ng/mL: Systemic infection (sepsis) is possible, but other conditions are known to elevate PCT as well. (NOTE)         ICU PCT Algorithm               Non ICU PCT Algorithm    ----------------------------     ------------------------------         PCT < 0.25 ng/mL                 PCT < 0.1 ng/mL     Stopping of antibiotics            Stopping of antibiotics       strongly  encouraged.               strongly encouraged.    ----------------------------     ------------------------------       PCT level decrease by               PCT < 0.25 ng/mL       >= 80% from peak PCT       OR PCT 0.25 - 0.5 ng/mL          Stopping of antibiotics                                             encouraged.     Stopping of antibiotics           encouraged.    ----------------------------     ------------------------------       PCT level decrease by              PCT >= 0.25 ng/mL       < 80% from peak PCT        AND PCT >= 0.5 ng/mL             Continuing antibiotics                                              encouraged.       Continuing antibiotics            encouraged.    ----------------------------     ------------------------------     PCT level increase compared          PCT > 0.5 ng/mL         with peak PCT AND          PCT >= 0.5 ng/mL             Escalation  of antibiotics                                          strongly encouraged.      Escalation of antibiotics        strongly encouraged.   Lactic acid, plasma     Status: Abnormal   Collection Time: 03/16/14  6:30 PM  Result Value Ref Range   Lactic Acid, Venous 3.9 (HH) 0.5 - 2.0 mmol/L    Comment: Please note change in reference range. REPEATED TO VERIFY CRITICAL RESULT CALLED TO, READ BACK BY AND VERIFIED WITH: DANIELARN [id number] [place]   Troponin I     Status: Abnormal   Collection Time: 03/16/14  6:30 PM  Result Value Ref Range   Troponin I 0.19 (H) <0.031 ng/mL    Comment:        PERSISTENTLY INCREASED TROPONIN VALUES IN THE RANGE OF 0.04-0.49 ng/mL CAN BE SEEN IN:       -UNSTABLE ANGINA       -CONGESTIVE HEART FAILURE       -MYOCARDITIS       -CHEST TRAUMA       -ARRYHTHMIAS       -LATE PRESENTING MYOCARDIAL INFARCTION       -COPD   CLINICAL FOLLOW-UP RECOMMENDED.   Cortisol     Status: None   Collection Time: 03/16/14  6:30 PM  Result Value Ref Range   Cortisol, Plasma 57.5  ug/dL    Comment: (NOTE) AM:  4.3 - 22.4 ug/dL PM:  3.1 - 16.7 ug/dL Performed at [facility]   CBC     Status: Abnormal   Collection Time: 03/16/14  6:30 PM  Result Value Ref Range   WBC 20.3 (H) 4.0 - 10.5 K/uL   RBC 2.93 (L) 4.22 - 5.81 MIL/uL   Hemoglobin 8.6 (L) 13.0 - 17.0 g/dL   HCT 25.6 (L) 39.0 - 52.0 %   MCV 87.4 78.0 - 100.0 fL   MCH 29.4 26.0 - 34.0 pg   MCHC 33.6 30.0 - 36.0 g/dL   RDW 16.0 (H) 11.5 - 15.5 %   Platelets 353 150 - 400 K/uL  Culture, blood (routine x 2)     Status: None (Preliminary result)   Collection Time: 03/16/14  6:35 PM  Result Value Ref Range   Specimen Description BLOOD RIGHT HAND    Special Requests BOTTLES DRAWN AEROBIC AND ANAEROBIC 5CC EA    Culture      GRAM POSITIVE COCCI IN CHAINS Note: CRITICAL RESULT CALLED TO, READ BACK BY AND VERIFIED WITH: Hessmer RN _0  VINCJ Performed at [facility]    Report Status PENDING   Triglycerides     Status: None   Collection Time: 03/16/14  6:38 PM  Result Value Ref Range   Triglycerides 80 <150 mg/dL  Type and screen     Status: None (Preliminary result)   Collection Time: 03/16/14  7:05 PM  Result Value Ref Range   ABO/RH(D) A POS    Antibody Screen NEG    Sample Expiration 03/19/2014    Unit Number [id number]    Blood Component Type RED CELLS,LR    Unit division 00    Status of Unit ISSUED,FINAL    Unit tag comment VERBAL ORDERS PER DR BYRUM    Transfusion Status OK TO TRANSFUSE    Crossmatch Result COMPATIBLE  Unit Number [id number]    Blood Component Type RED CELLS,LR    Unit division 00    Status of Unit DISCARDED    Unit tag comment VERBAL ORDERS PER DR BYRUM    Transfusion Status OK TO TRANSFUSE    Crossmatch Result COMPATIBLE    Unit Number [id number]    Blood Component Type RED CELLS,LR    Unit division 00    Status of Unit ISSUED,FINAL    Unit tag comment VERBAL ORDERS PER DR BYRUM    Transfusion Status OK TO TRANSFUSE    Crossmatch  Result COMPATIBLE    Unit Number [id number]    Blood Component Type RED CELLS,LR    Unit division 00    Status of Unit ALLOCATED    Unit tag comment VERBAL ORDERS PER DR BYRUM    Transfusion Status OK TO TRANSFUSE    Crossmatch Result COMPATIBLE    Unit Number [id number]    Blood Component Type RED CELLS,LR    Unit division 00    Status of Unit ALLOCATED    Unit tag comment VERBAL ORDERS PER DR BYRUM    Transfusion Status OK TO TRANSFUSE    Crossmatch Result COMPATIBLE    Unit Number [id number]    Blood Component Type RED CELLS,LR    Unit division 00    Status of Unit ISSUED,FINAL    Unit tag comment VERBAL ORDERS PER DR BYRUM    Transfusion Status OK TO TRANSFUSE    Crossmatch Result COMPATIBLE    Unit Number [id number]    Blood Component Type RED CELLS,LR    Unit division 00    Status of Unit ALLOCATED    Unit tag comment VERBAL ORDERS PER DR BYRUM    Transfusion Status OK TO TRANSFUSE    Crossmatch Result COMPATIBLE   Prepare RBC     Status: None   Collection Time: 03/16/14  7:05 PM  Result Value Ref Range   Order Confirmation ORDER PROCESSED BY BLOOD BANK   ABO/Rh     Status: None   Collection Time: 03/16/14  7:05 PM  Result Value Ref Range   ABO/RH(D) A POS   Clotest (H. pylori), biopsy     Status: None   Collection Time: 03/16/14  7:30 PM  Result Value Ref Range   Helicobacter screen UREASE NEGATIVE UREASE NEGATIVE    Comment:        CLOTEST DETECTS THE UREASE ENZYME OF HELICOBACTER PYLORI IN GASTRIC MUCOSAL BIOPSIES.   Urinalysis, Routine w reflex microscopic     Status: Abnormal   Collection Time: 03/16/14  8:25 PM  Result Value Ref Range   Color, Urine YELLOW YELLOW   APPearance CLEAR CLEAR   Specific Gravity, Urine >1.046 (H) 1.005 - 1.030   pH 6.0 5.0 - 8.0   Glucose, UA NEGATIVE NEGATIVE mg/dL   Hgb urine dipstick TRACE (A) NEGATIVE   Bilirubin Urine NEGATIVE NEGATIVE   Ketones, ur NEGATIVE NEGATIVE mg/dL   Protein, ur NEGATIVE  NEGATIVE mg/dL   Urobilinogen, UA 0.2 0.0 - 1.0 mg/dL   Nitrite NEGATIVE NEGATIVE   Leukocytes, UA NEGATIVE NEGATIVE  Urine microscopic-add on     Status: Abnormal   Collection Time: 03/16/14  8:25 PM  Result Value Ref Range   Squamous Epithelial / LPF FEW (A) RARE   WBC, UA 0-2 <3 WBC/hpf   RBC / HPF 3-6 <3 RBC/hpf   Bacteria, UA FEW (A) RARE  Prepare RBC     Status: None  Collection Time: 03/16/14  8:30 PM  Result Value Ref Range   Order Confirmation ORDER PROCESSED BY BLOOD BANK   Prepare fresh frozen plasma     Status: None   Collection Time: 03/16/14  8:30 PM  Result Value Ref Range   Unit Number [id number]    Blood Component Type THW PLS APHR    Unit division B0    Status of Unit ISSUED,FINAL    Transfusion Status OK TO TRANSFUSE    Unit Number [id number]    Blood Component Type THW PLS APHR    Unit division A0    Status of Unit ISSUED,FINAL    Transfusion Status OK TO TRANSFUSE    Unit Number [id number]    Blood Component Type THAWED PLASMA    Unit division 00    Status of Unit ISSUED,FINAL    Transfusion Status OK TO TRANSFUSE    Unit Number [id number]    Blood Component Type THW PLS APHR    Unit division A0    Status of Unit ISSUED,FINAL    Transfusion Status OK TO TRANSFUSE   CBC     Status: Abnormal   Collection Time: 03/16/14  9:19 PM  Result Value Ref Range   WBC 35.0 (H) 4.0 - 10.5 K/uL   RBC 3.95 (L) 4.22 - 5.81 MIL/uL   Hemoglobin 11.6 (L) 13.0 - 17.0 g/dL    Comment: REPEATED TO VERIFY   HCT 33.9 (L) 39.0 - 52.0 %   MCV 85.8 78.0 - 100.0 fL   MCH 29.4 26.0 - 34.0 pg   MCHC 34.2 30.0 - 36.0 g/dL   RDW 15.1 11.5 - 15.5 %   Platelets 297 150 - 400 K/uL  Blood gas, arterial     Status: Abnormal   Collection Time: 03/16/14 10:50 PM  Result Value Ref Range   FIO2 0.60 %   Delivery systems VENTILATOR    Mode PRESSURE REGULATED VOLUME CONTROL    VT 550 mL   Rate 16 resp/min   Peep/cpap 5.0 cm H20   pH, Arterial 7.341 (L) 7.350 - 7.450    pCO2 arterial 30.1 (L) 35.0 - 45.0 mmHg   pO2, Arterial 328.0 (H) 80.0 - 100.0 mmHg   Bicarbonate 15.8 (L) 20.0 - 24.0 mEq/L   TCO2 16.7 0 - 100 mmol/L   Acid-base deficit 8.8 (H) 0.0 - 2.0 mmol/L   O2 Saturation 99.5 %   Patient temperature 98.6    Collection site RIGHT RADIAL    Drawn by COLLECTED BY RT    Sample type ARTERIAL DRAW    Allens test (pass/fail) PASS PASS  Troponin I     Status: Abnormal   Collection Time: 03/16/14 11:54 PM  Result Value Ref Range   Troponin I 0.35 (H) <0.031 ng/mL    Comment:        PERSISTENTLY INCREASED TROPONIN VALUES IN THE RANGE OF 0.04-0.49 ng/mL CAN BE SEEN IN:       -UNSTABLE ANGINA       -CONGESTIVE HEART FAILURE       -MYOCARDITIS       -CHEST TRAUMA       -ARRYHTHMIAS       -LATE PRESENTING MYOCARDIAL INFARCTION       -COPD   CLINICAL FOLLOW-UP RECOMMENDED.   CBC     Status: Abnormal   Collection Time: 03/17/14  2:00 AM  Result Value Ref Range   WBC 13.6 (H) 4.0 - 10.5 K/uL   RBC 2.73 (L) 4.22 -  5.81 MIL/uL   Hemoglobin 8.2 (L) 13.0 - 17.0 g/dL    Comment: REPEATED TO VERIFY   HCT 23.2 (L) 39.0 - 52.0 %   MCV 85.0 78.0 - 100.0 fL   MCH 30.0 26.0 - 34.0 pg   MCHC 35.3 30.0 - 36.0 g/dL   RDW 15.3 11.5 - 15.5 %   Platelets 164 150 - 400 K/uL    Comment: REPEATED TO VERIFY  Troponin I     Status: Abnormal   Collection Time: 03/17/14  5:30 AM  Result Value Ref Range   Troponin I 0.39 (H) <0.031 ng/mL    Comment:        PERSISTENTLY INCREASED TROPONIN VALUES IN THE RANGE OF 0.04-0.49 ng/mL CAN BE SEEN IN:       -UNSTABLE ANGINA       -CONGESTIVE HEART FAILURE       -MYOCARDITIS       -CHEST TRAUMA       -ARRYHTHMIAS       -LATE PRESENTING MYOCARDIAL INFARCTION       -COPD   CLINICAL FOLLOW-UP RECOMMENDED.   Basic metabolic panel     Status: Abnormal   Collection Time: 03/17/14  5:30 AM  Result Value Ref Range   Sodium 141 135 - 145 mmol/L   Potassium 3.8 3.5 - 5.1 mmol/L   Chloride 116 (H) 96 - 112 mmol/L   CO2  21 19 - 32 mmol/L   Glucose, Bld 111 (H) 70 - 99 mg/dL   BUN 20 6 - 23 mg/dL   Creatinine, Ser 0.93 0.50 - 1.35 mg/dL   Calcium 7.1 (L) 8.4 - 10.5 mg/dL   GFR calc non Af Amer >90 >90 mL/min   GFR calc Af Amer >90 >90 mL/min    Comment: (NOTE) The eGFR has been calculated using the CKD EPI equation. This calculation has not been validated in all clinical situations. eGFR's persistently <90 mL/min signify possible Chronic Kidney Disease.    Anion gap 4 (L) 5 - 15  Magnesium     Status: None   Collection Time: 03/17/14  5:30 AM  Result Value Ref Range   Magnesium 1.5 1.5 - 2.5 mg/dL  Phosphorus     Status: None   Collection Time: 03/17/14  5:30 AM  Result Value Ref Range   Phosphorus 4.4 2.3 - 4.6 mg/dL  CBC     Status: Abnormal   Collection Time: 03/17/14  7:30 AM  Result Value Ref Range   WBC 10.3 4.0 - 10.5 K/uL   RBC 2.29 (L) 4.22 - 5.81 MIL/uL   Hemoglobin 7.0 (L) 13.0 - 17.0 g/dL   HCT 19.4 (L) 39.0 - 52.0 %   MCV 84.7 78.0 - 100.0 fL   MCH 30.6 26.0 - 34.0 pg   MCHC 36.1 (H) 30.0 - 36.0 g/dL   RDW 15.7 (H) 11.5 - 15.5 %   Platelets 154 150 - 400 K/uL  Prepare RBC     Status: None   Collection Time: 03/17/14  9:10 AM  Result Value Ref Range   Order Confirmation ORDER PROCESSED BY BLOOD BANK   Protime-INR     Status: Abnormal   Collection Time: 03/17/14  9:20 AM  Result Value Ref Range   Prothrombin Time 16.7 (H) 11.6 - 15.2 seconds   INR 1.34 0.00 - 1.49  CBC     Status: Abnormal   Collection Time: 03/17/14  2:30 PM  Result Value Ref Range   WBC 11.3 (H) 4.0 -  10.5 K/uL   RBC 2.88 (L) 4.22 - 5.81 MIL/uL   Hemoglobin 8.5 (L) 13.0 - 17.0 g/dL    Comment: POST TRANSFUSION SPECIMEN   HCT 24.6 (L) 39.0 - 52.0 %   MCV 85.4 78.0 - 100.0 fL   MCH 29.5 26.0 - 34.0 pg   MCHC 34.6 30.0 - 36.0 g/dL   RDW 15.9 (H) 11.5 - 15.5 %   Platelets 198 150 - 400 K/uL  CBC     Status: Abnormal   Collection Time: 03/17/14  8:00 PM  Result Value Ref Range   WBC 7.7 4.0 - 10.5  K/uL   RBC 2.80 (L) 4.22 - 5.81 MIL/uL   Hemoglobin 8.4 (L) 13.0 - 17.0 g/dL   HCT 24.1 (L) 39.0 - 52.0 %   MCV 86.1 78.0 - 100.0 fL   MCH 30.0 26.0 - 34.0 pg   MCHC 34.9 30.0 - 36.0 g/dL   RDW 15.9 (H) 11.5 - 15.5 %   Platelets 182 150 - 400 K/uL  Basic metabolic panel     Status: Abnormal   Collection Time: 03/18/14  3:06 AM  Result Value Ref Range   Sodium 143 135 - 145 mmol/L   Potassium 3.7 3.5 - 5.1 mmol/L   Chloride 115 (H) 96 - 112 mmol/L   CO2 23 19 - 32 mmol/L   Glucose, Bld 120 (H) 70 - 99 mg/dL   BUN 20 6 - 23 mg/dL   Creatinine, Ser 0.86 0.50 - 1.35 mg/dL   Calcium 7.4 (L) 8.4 - 10.5 mg/dL   GFR calc non Af Amer >90 >90 mL/min   GFR calc Af Amer >90 >90 mL/min    Comment: (NOTE) The eGFR has been calculated using the CKD EPI equation. This calculation has not been validated in all clinical situations. eGFR's persistently <90 mL/min signify possible Chronic Kidney Disease.    Anion gap 5 5 - 15  CBC with Differential/Platelet     Status: Abnormal   Collection Time: 03/18/14  3:06 AM  Result Value Ref Range   WBC 7.6 4.0 - 10.5 K/uL   RBC 2.74 (L) 4.22 - 5.81 MIL/uL   Hemoglobin 8.2 (L) 13.0 - 17.0 g/dL   HCT 23.6 (L) 39.0 - 52.0 %   MCV 86.1 78.0 - 100.0 fL   MCH 29.9 26.0 - 34.0 pg   MCHC 34.7 30.0 - 36.0 g/dL   RDW 16.4 (H) 11.5 - 15.5 %   Platelets 178 150 - 400 K/uL   Neutrophils Relative % 50 43 - 77 %   Neutro Abs 3.8 1.7 - 7.7 K/uL   Lymphocytes Relative 37 12 - 46 %   Lymphs Abs 2.8 0.7 - 4.0 K/uL   Monocytes Relative 13 (H) 3 - 12 %   Monocytes Absolute 0.9 0.1 - 1.0 K/uL   Eosinophils Relative 0 0 - 5 %   Eosinophils Absolute 0.0 0.0 - 0.7 K/uL   Basophils Relative 0 0 - 1 %   Basophils Absolute 0.0 0.0 - 0.1 K/uL    Imaging / Studies: Dg Chest Port 1 View  03/16/2014   CLINICAL DATA:  Central line placement, GI bleed  EXAM: PORTABLE CHEST - 1 VIEW  COMPARISON:  03/16/2014  FINDINGS: Cardiomediastinal silhouette is stable. Endotracheal tube  in place with tip 4.6 cm above the carina. NG tube coiled within stomach with tip in distal stomach. There is right IJ central line with tip in distal SVC. No pneumothorax. No acute infiltrate or pulmonary  edema.  IMPRESSION: NG tube and endotracheal tube in place. Right IJ central line in place. No pneumothorax.   Electronically Signed   By: Lahoma Crocker M.D.   On: 03/16/2014 19:24    Medications / Allergies:  Scheduled Meds: . antiseptic oral rinse  7 mL Mouth Rinse QID  . chlorhexidine  15 mL Mouth Rinse BID  . ciprofloxacin  400 mg Intravenous Q12H  . metronidazole  500 mg Intravenous Q8H  . nicotine  21 mg Transdermal Daily  . pneumococcal 23 valent vaccine  0.5 mL Intramuscular Tomorrow-1000  . sodium chloride  10-40 mL Intracatheter Q12H  . vancomycin  1,000 mg Intravenous Q8H   Continuous Infusions: . dextrose 5 % and 0.45% NaCl 50 mL/hr at 03/18/14 0400  . HYDROmorphone 3 mg/hr (03/17/14 2200)  . norepinephrine (LEVOPHED) Adult infusion 10 mcg/min (03/17/14 2010)  . pantoprozole (PROTONIX) infusion 8 mg/hr (03/18/14 0755)  . propofol 12 mcg/kg/min (03/18/14 0258)   PRN Meds:.midazolam, midazolam, ondansetron (ZOFRAN) IV, sodium chloride  Antibiotics: Anti-infectives    Start     Dose/Rate Route Frequency Ordered Stop   03/16/14 2000  ciprofloxacin (CIPRO) IVPB 400 mg     400 mg200 mL/hr over 60 Minutes Intravenous Every 12 hours 03/16/14 1811     03/16/14 2000  vancomycin (VANCOCIN) IVPB 1000 mg/200 mL premix     1,000 mg200 mL/hr over 60 Minutes Intravenous Every 8 hours 03/16/14 1811     03/16/14 1900  erythromycin 500 mg in sodium chloride 0.9 % 100 mL IVPB  Status:  Discontinued     500 mg100 mL/hr over 60 Minutes Intravenous  Once 03/16/14 1815 03/16/14 2009   03/16/14 1800  metroNIDAZOLE (FLAGYL) IVPB 500 mg     500 mg100 mL/hr over 60 Minutes Intravenous Every 8 hours 03/16/14 1742          Assessment/Plan Large bleeding duodenal ulcer S/p EGD 1/23--injected.   No bleeding at present time High risk for rebleeding.   Should this occur, EGD, IR angio, then surgery Will follow    Erby Pian, [facility] Pager [phone](7A-4:30P) For consults and floor pages call [phone](7A-4:30P)  03/18/2014 8:24 AM

## 2014-03-18 NOTE — Procedures (Signed)
Extubation Procedure Note  Patient Details:   Name: Shane Stone DOB: April 02, 1985 MRN: [id number]   Pt extubated to 4L [place] per MD order. Pt able to vocalize, VS stable. Pt tolerating well at this time. RT will continue to monitor.    Evaluation  O2 sats: stable throughout Complications: No apparent complications Patient did tolerate procedure well. Bilateral Breath Sounds: Clear Suctioning:  (none) Yes  Harley Hallmark 03/18/2014, 9:51 AM

## 2014-03-19 LAB — COMPREHENSIVE METABOLIC PANEL
ALT: 12 U/L (ref 0–53)
AST: 13 U/L (ref 0–37)
Albumin: 1.8 g/dL — ABNORMAL LOW (ref 3.5–5.2)
Alkaline Phosphatase: 39 U/L (ref 39–117)
Anion gap: 6 (ref 5–15)
BUN: 8 mg/dL (ref 6–23)
CO2: 27 mmol/L (ref 19–32)
Calcium: 7.6 mg/dL — ABNORMAL LOW (ref 8.4–10.5)
Chloride: 109 mmol/L (ref 96–112)
Creatinine, Ser: 0.72 mg/dL (ref 0.50–1.35)
GFR calc Af Amer: >90 mL/min (ref >90)
GFR calc non Af Amer: >90 mL/min (ref >90)
Glucose, Bld: 107 mg/dL — ABNORMAL HIGH (ref 70–99)
Potassium: 2.8 mmol/L — ABNORMAL LOW (ref 3.5–5.1)
Sodium: 142 mmol/L (ref 135–145)
Total Bilirubin: 0.4 mg/dL (ref 0.3–1.2)
Total Protein: 4.1 g/dL — ABNORMAL LOW (ref 6.0–8.3)

## 2014-03-19 LAB — CBC
HCT: 24 % — ABNORMAL LOW (ref 39.0–52.0)
Hemoglobin: 8.2 g/dL — ABNORMAL LOW (ref 13.0–17.0)
MCH: 30 pg (ref 26.0–34.0)
MCHC: 34.2 g/dL (ref 30.0–36.0)
MCV: 87.9 fL (ref 78.0–100.0)
Platelets: 178 10*3/uL (ref 150–400)
RBC: 2.73 MIL/uL — ABNORMAL LOW (ref 4.22–5.81)
RDW: 15.5 % (ref 11.5–15.5)
WBC: 4.9 10*3/uL (ref 4.0–10.5)

## 2014-03-19 LAB — CLOSTRIDIUM DIFFICILE BY PCR: Toxigenic C. Difficile by PCR: POSITIVE — AB

## 2014-03-19 MED ORDER — METRONIDAZOLE 500 MG PO TABS
500.0000 mg | ORAL_TABLET | Freq: Three times a day (TID) | ORAL | Status: DC
  Administered 2014-03-19 – 2014-03-22 (×10): 500 mg via ORAL
  Filled 2014-03-19 (×15): qty 1

## 2014-03-19 MED ORDER — POTASSIUM CHLORIDE CRYS ER 20 MEQ PO TBCR
40.0000 meq | EXTENDED_RELEASE_TABLET | ORAL | Status: AC
  Administered 2014-03-19 (×3): 40 meq via ORAL
  Filled 2014-03-19 (×3): qty 2

## 2014-03-19 NOTE — Progress Notes (Signed)
PULMONARY / CRITICAL CARE MEDICINE   Name: Shane Stone MRN: [id number] DOB: 14-Oct-1985    ADMISSION DATE:  03/16/2014  CHIEF COMPLAINT:  Abdominal pain, GIB  INITIAL PRESENTATION: 29 yo man, hx Crohn's, narcotics use, admitted with massive GIB, suspected PUD / ulcer. Admitted for stabilization 1/23 to facilitate EGD.   STUDIES:  1/23 EGD> "giant" duodenoal bulb ulcer with massive bleeding clipped and epinephrine injected; hemostasis achieved but high risk of bleeding  SIGNIFICANT EVENTS: 1/23 admitted, endoscopy as above, surgery consult 1/23 CT abdomen> large clot and fluid collectsion in stomach, small bowel enhancement consistent with crohns, improved from prior, fluid collection in abdominal wall from prior abscess also improved 1/25 Successfully extubated 1/26 C diff positive - unclear if false positive or tru positive. Metronidazole initiated  SUBJECTIVE:  No new complaints. Melenic diarrhea. C diff antigen study sent per hospital policy and returned positive  VITAL SIGNS: Temp:  [97.5 F (36.4 C)-98.9 F (37.2 C)] 98 F (36.7 C) (01/26 1100) Pulse Rate:  [56-110] 70 (01/26 1100) Resp:  [11-19] 15 (01/26 1100) BP: (95-131)/(48-82) 95/58 mmHg (01/26 1100) SpO2:  [88 %-100 %] 100 % (01/26 1100) HEMODYNAMICS:   VENTILATOR SETTINGS:   INTAKE / OUTPUT:  Intake/Output Summary (Last 24 hours) at 03/19/14 1333 Last data filed at 03/19/14 1100  Gross per 24 hour  Intake 2433.8 ml  Output   2640 ml  Net -206.2 ml    PHYSICAL EXAMINATION: General:  NAD HEENT: WNL PULM: Clear CV: Reg, no M AB: + BS, soft Ext: no edema Neuro: intact  LABS: I have reviewed all of today's lab results. Relevant abnormalities are discussed in the A/P section   ASSESSMENT / PLAN:  GASTROINTESTINAL A:  Acute GIB Large duodenal ulcer, clipped, injected 1/24 - High risk re-bleed. No evidence of rebleeding presently Crohn's disease, not currently on rx.  Hx intra-abd abscesses s/p  drainage, last 10/2013 > improved per 1/23 CT Melena is likely due to residual blood in GI tract and not indicative of ongoing bleeding P:   GI medicine and surgery following Advance diet as tolerated Cont PPI infusion > high dose intermittent PPI per protocol  PULMONARY A: Acute respiratory failure, resolved P:   Supp O2 PRN to maintain SpO2 > 92%  CARDIOVASCULAR CVL 1/23 >>  A: Hemorrhagic shock, resolved Poor venous access P:  Will leave R IJ CVL for now  RENAL A:   Hypokalemia P:   Monitor BMET intermittently Monitor I/Os Correct electrolytes as indicated  HEMATOLOGIC A:   Acute blood loss anemia P:  DVT px: SCDs Monitor CBC intermittently Transfuse per usual ICU guidelines  INFECTIOUS A:   Doubt R abdominal cellulitis / subcutaneous abscess Doubt intra-abd infection C diff antigen positive - might be false positive P:    vanco 1/23 >> d/c 1/25 if cultures neg cipro 1/23 >> 1/25 Flagyl 1/23 >> 1/25  Metronidazole 1/26 >> (14 days per protocol, through 02/09)  ENDOCRINE A:  Hyperglycemia, resolved P:  Follow glu on chem panels No SSI indicated  NEUROLOGIC A:   Lethargy, resolved  Chronic pain P:   RASS goal: 0  Cont PRN hydromorphone post extubation   Transfer to med-surg. TRH to assume care as of AM 1/26 and PCCM to sign off. Discussed with Dr Chanda Busing, MD ; [facility] [phone].  After 5:30 PM or weekends, call [phone]

## 2014-03-19 NOTE — Progress Notes (Signed)
Patient ID: Shane Stone, male   DOB: November 30, 1985, 29 y.o.   MRN: [id number]  Remains hemodynamically stable Diet advancing No signs of acute bleeding. Will sign off.  Calls [place] back if needed.

## 2014-03-19 NOTE — Progress Notes (Signed)
[facility] ADULT ICU REPLACEMENT PROTOCOL FOR AM LAB REPLACEMENT ONLY  The patient does apply for the [facility] Adult ICU Electrolyte Replacment Protocol based on the criteria listed below:   1. Is GFR >/= 40 ml/min? Yes.    Patient's GFR today is >90 2. Is urine output >/= 0.5 ml/kg/hr for the last 6 hours? Yes.   Patient's UOP is 2.1 ml/kg/hr 3. Is BUN < 60 mg/dL? Yes.    Patient's BUN today is 8 4. Abnormal electrolyte(s): Potassium 5. Ordered repletion with: Potassium per Protocol    Shane Stone P 03/19/2014 5:42 AM

## 2014-03-19 NOTE — Progress Notes (Signed)
[facility] Progress Note    Since last GI note: Having dark stools, loose (yesterday once, this AM once).  No vomiting.  He's been HD stable.   Objective: Vital signs in last 24 hours: Temp:  [98.4 F (36.9 C)-98.9 F (37.2 C)] 98.4 F (36.9 C) (01/26 0400) Pulse Rate:  [54-110] 70 (01/26 0700) Resp:  [8-19] 14 (01/26 0700) BP: (100-168)/(48-82) 106/48 mmHg (01/26 0700) SpO2:  [88 %-100 %] 99 % (01/26 0700) FiO2 (%):  [30 %] 30 % (01/25 0931) Last BM Date: 03/19/14 General: alert and oriented times 3 Heart: regular rate and rythm Abdomen: soft, non-tender, non-distended, normal bowel sounds   Lab Results:  Recent Labs  03/18/14 0306 03/18/14 0800 03/19/14 0445  WBC 7.6 7.6 4.9  HGB 8.2* 8.3* 8.2*  PLT 178 184 178  MCV 86.1 87.5 87.9    Recent Labs  03/17/14 0530 03/18/14 0306 03/19/14 0445  NA 141 143 142  K 3.8 3.7 2.8*  CL 116* 115* 109  CO2 21 23 27   GLUCOSE 111* 120* 107*  BUN 20 20 8   CREATININE 0.93 0.86 0.72  CALCIUM 7.1* 7.4* 7.6*    Recent Labs  03/16/14 1830 03/19/14 0445  PROT <3.0* 4.1*  ALBUMIN 1.2* 1.8*  AST 20 13  ALT 12 12  ALKPHOS 24* 39  BILITOT 0.3 0.4    Recent Labs  03/17/14 0920  INR 1.34    Medications: Scheduled Meds: . antiseptic oral rinse  7 mL Mouth Rinse q12n4p  . chlorhexidine  15 mL Mouth Rinse BID  . nicotine  21 mg Transdermal Daily  . pantoprazole (PROTONIX) IV  40 mg Intravenous Q12H  . pneumococcal 23 valent vaccine  0.5 mL Intramuscular Tomorrow-1000  . potassium chloride  40 mEq Oral Q4H  . sodium chloride  10-40 mL Intracatheter Q12H   Continuous Infusions: . dextrose 5 % and 0.45% NaCl 50 mL/hr at 03/19/14 0211  . norepinephrine (LEVOPHED) Adult infusion Stopped (03/18/14 2030)  . pantoprozole (PROTONIX) infusion 8 mg/hr (03/19/14 0530)   PRN Meds:.HYDROmorphone (DILAUDID) injection, ondansetron (ZOFRAN) IV, sodium chloride    Assessment/Plan: 29 y.o. male large bleeding  duodenal ulcer, s/p endoscopic treatment 2 days ago  CLO test was negative, he has been taking huge amounts of NSAIDs and understands that is what caused the ulcer and that the best 'cure' is to refrain from further use.  No signs of rebleeding, I suspect that the dark loose stools represents 'old' blood.  Should continue IV PPI another 12 hours and then start PPI IV BID for another 24 hours.  Will allow him to advance diet as tolerated. Will follow along.  Rachael Fee, MD  03/19/2014, 8:27 AM [facility] Pager [phone]

## 2014-03-20 ENCOUNTER — Telehealth: Payer: Self-pay | Admitting: Internal Medicine

## 2014-03-20 DIAGNOSIS — R7881 Bacteremia: Secondary | ICD-10-CM

## 2014-03-20 DIAGNOSIS — B954 Other streptococcus as the cause of diseases classified elsewhere: Secondary | ICD-10-CM

## 2014-03-20 DIAGNOSIS — B955 Unspecified streptococcus as the cause of diseases classified elsewhere: Secondary | ICD-10-CM | POA: Insufficient documentation

## 2014-03-20 LAB — TYPE AND SCREEN
ABO/RH(D): A POS
Antibody Screen: NEGATIVE
Unit division: 0
Unit division: 0
Unit division: 0
Unit division: 0
Unit division: 0
Unit division: 0
Unit division: 0

## 2014-03-20 LAB — CBC
HCT: 21.7 % — ABNORMAL LOW (ref 39.0–52.0)
Hemoglobin: 7.3 g/dL — ABNORMAL LOW (ref 13.0–17.0)
MCH: 29.7 pg (ref 26.0–34.0)
MCHC: 33.6 g/dL (ref 30.0–36.0)
MCV: 88.2 fL (ref 78.0–100.0)
Platelets: 172 10*3/uL (ref 150–400)
RBC: 2.46 MIL/uL — ABNORMAL LOW (ref 4.22–5.81)
RDW: 15.6 % — ABNORMAL HIGH (ref 11.5–15.5)
WBC: 3.8 10*3/uL — ABNORMAL LOW (ref 4.0–10.5)

## 2014-03-20 LAB — BASIC METABOLIC PANEL
Anion gap: <5 — ABNORMAL LOW (ref 5–15)
BUN: 6 mg/dL (ref 6–23)
CO2: 29 mmol/L (ref 19–32)
Calcium: 7.3 mg/dL — ABNORMAL LOW (ref 8.4–10.5)
Chloride: 109 mmol/L (ref 96–112)
Creatinine, Ser: 0.76 mg/dL (ref 0.50–1.35)
GFR calc Af Amer: >90 mL/min (ref >90)
GFR calc non Af Amer: >90 mL/min (ref >90)
Glucose, Bld: 104 mg/dL — ABNORMAL HIGH (ref 70–99)
Potassium: 3.2 mmol/L — ABNORMAL LOW (ref 3.5–5.1)
Sodium: 138 mmol/L (ref 135–145)

## 2014-03-20 MED ORDER — VANCOMYCIN HCL IN DEXTROSE 1-5 GM/200ML-% IV SOLN
1000.0000 mg | Freq: Three times a day (TID) | INTRAVENOUS | Status: DC
  Administered 2014-03-20 – 2014-03-22 (×6): 1000 mg via INTRAVENOUS
  Filled 2014-03-20 (×7): qty 200

## 2014-03-20 MED ORDER — FERROUS SULFATE 325 (65 FE) MG PO TABS
325.0000 mg | ORAL_TABLET | Freq: Every day | ORAL | Status: DC
Start: 2014-03-20 — End: 2014-03-22
  Administered 2014-03-20 – 2014-03-22 (×3): 325 mg via ORAL
  Filled 2014-03-20 (×5): qty 1

## 2014-03-20 MED ORDER — PANTOPRAZOLE SODIUM 40 MG PO TBEC
40.0000 mg | DELAYED_RELEASE_TABLET | Freq: Two times a day (BID) | ORAL | Status: DC
  Administered 2014-03-20 – 2014-03-22 (×5): 40 mg via ORAL
  Filled 2014-03-20 (×5): qty 1

## 2014-03-20 MED ORDER — POTASSIUM CHLORIDE CRYS ER 20 MEQ PO TBCR
40.0000 meq | EXTENDED_RELEASE_TABLET | Freq: Once | ORAL | Status: AC
  Administered 2014-03-20: 40 meq via ORAL
  Filled 2014-03-20: qty 2

## 2014-03-20 NOTE — Telephone Encounter (Signed)
We did not have a productive doctor patient relationship. He should establish a productive relationship with another GI physician.Marland Kitchen

## 2014-03-20 NOTE — Telephone Encounter (Signed)
Dr Jerral Ralph called this afternoon wanting to make a hospital ([facility]) follow up appointment with [place]. I told him that the patient had been discharged from our practice in July 2012 and I could not make OV unless I had permission from RMR if he wanted to establish care with him again. Patient is being discharged from [facility] hospital possibly tomorrow and Dr Jerral Ralph wanted to know what we were going to do so he can get GI follow up care for patient. I told him that I would send a phone note to RMR and office manager and they would have to decide and someone would contact the patient. He agreed. Please advise.

## 2014-03-20 NOTE — Progress Notes (Signed)
UR completed.  Natisha Trzcinski, RN BSN M[facility] CCM Trauma/Neuro ICU Case Manager [phone]  

## 2014-03-20 NOTE — Progress Notes (Signed)
PATIENT DETAILS Name: Shane Stone Age: 29 y.o. Sex: male Date of Birth: Jun 04, 1985 Admit Date: 03/16/2014 Admitting Physician Lupita Leash, MD PCP:No PCP Per Patient  Subjective: No major complaints. Brown stools this am.  Assessment/Plan: Active Problems:   Hemorrhagic shock:resolved. Secondary to UGI bleed, from bleeding large duodenal ulcer.     GIB (gastrointestinal bleeding):Admitted, seen by GI- underwent EGD on 1/23 EGD> "giant" duodenoal bulb ulcer with massive bleeding clipped and epinephrine injected; hemostasis was achieved but was considered high risk of bleeding and was monitored closely in the ICU, general surgery was also consulted.Patient was continued on PPI. Thankfully GI bleeding seems to have resolved, PPI now changed to oral. If no more overt bleeding, suspect will be ok to discharge tomorrow am.Patient instructed to avoid NSAID's in the future, likely this ulcer was NSAID induced.    Acute Blood Loss Anemia:Transfused multiple units of PRBC this admission. Hb slowly has drifted down to 7.3-suspect this is from re-equilibration rather than ongoing blood loss-as patient has brown stools. Will repeat CBC in am.    Acute Resp Failure:intubated for airway protection on admission, extubated on 1/25. Doing well,now on room air.    C Diff Colitis:diarrhea better, continue Flagyl-through 04/02/14    Streptococcal Bacteremia:1 out of 2 Blood cultures on 03/16/13 positive for nutritionally deficient Strep-complicated hx with recent Intraabdominal abscess, crohn's colitis. Spoke with Dr Drue Second, advised to restart IV Vancomycin. Will repeat Blood cultures and check Echo.ID formally consulted.    Hx intra-abd abscesses s/p drainage, last 10/2013 : improved per 1/23 CT    Hx of Crohn's colitis:patient asked to follow up with primary GI in the next 2-3 weeks for follow up. I have asked patient to call and make an appointment.     Chronic Pain syndrome: avoid  NSAID's.Likely secondary to above.     Tobacco Abuse:counseled, continue transdermal Nicotine.  Disposition: Remain inpatient  Antibiotics: See below  Anti-infectives    Start     Dose/Rate Route Frequency Ordered Stop   03/19/14 1100  metroNIDAZOLE (FLAGYL) tablet 500 mg     500 mg Oral 3 times per day 03/19/14 1046 04/02/14 1359   03/16/14 2000  ciprofloxacin (CIPRO) IVPB 400 mg  Status:  Discontinued     400 mg200 mL/hr over 60 Minutes Intravenous Every 12 hours 03/16/14 1811 03/18/14 0949   03/16/14 2000  vancomycin (VANCOCIN) IVPB 1000 mg/200 mL premix  Status:  Discontinued     1,000 mg200 mL/hr over 60 Minutes Intravenous Every 8 hours 03/16/14 1811 03/18/14 0949   03/16/14 1900  erythromycin 500 mg in sodium chloride 0.9 % 100 mL IVPB  Status:  Discontinued     500 mg100 mL/hr over 60 Minutes Intravenous  Once 03/16/14 1815 03/16/14 2009   03/16/14 1800  metroNIDAZOLE (FLAGYL) IVPB 500 mg  Status:  Discontinued     500 mg100 mL/hr over 60 Minutes Intravenous Every 8 hours 03/16/14 1742 03/18/14 0949      DVT Prophylaxis:  SCD's  Code Status: Full code   Family Communication None at bedside  Procedures: 1/23 EGD  1/23 ETT  CONSULTS:  pulmonary/intensive care, GI and general surgery  Time spent 40 minutes-which includes 50% of the time with face-to-face with patient/ family and coordinating care related to the above assessment and plan.  MEDICATIONS: Scheduled Meds: . ferrous sulfate  325 mg Oral Q breakfast  . metroNIDAZOLE  500 mg Oral 3 times per day  .  nicotine  21 mg Transdermal Daily  . pantoprazole  40 mg Oral BID AC  . pneumococcal 23 valent vaccine  0.5 mL Intramuscular Tomorrow-1000  . sodium chloride  10-40 mL Intracatheter Q12H   Continuous Infusions:  PRN Meds:.HYDROmorphone (DILAUDID) injection, ondansetron (ZOFRAN) IV, sodium chloride    PHYSICAL EXAM: Vital signs in last 24 hours: Filed Vitals:   03/19/14 1600 03/19/14 2007 03/20/14  0602 03/20/14 1323  BP: 114/69 102/57 114/73 117/75  Pulse: 40 80 70 65  Temp:  98.3 F (36.8 C) 98.1 F (36.7 C) 98.1 F (36.7 C)  TempSrc:  Oral Oral Oral  Resp: Height:      Weight:      SpO2: 92% 100% 100% 100%    Weight change:  Filed Weights   03/16/14 1842 03/17/14 0500 03/18/14 0444  Weight: 62 kg (136 lb 11 oz) 65.5 kg (144 lb 6.4 oz) 64.7 kg (142 lb 10.2 oz)   Body mass index is 21.69 kg/(m^2).   Gen Exam: Awake and alert with clear speech.   Neck: Supple, No JVD.   Chest: B/L Clear.   CVS: S1 S2 Regular, no murmurs.  Abdomen: soft, BS +, non tender, non distended.  Extremities: no edema, lower extremities warm to touch. Neurologic: Non Focal.   Skin: No Rash.   Wounds: N/A.    Intake/Output from previous day:  Intake/Output Summary (Last 24 hours) at 03/20/14 1523 Last data filed at 03/20/14 1425  Gross per 24 hour  Intake   1085 ml  Output      0 ml  Net   1085 ml     LAB RESULTS: CBC  Recent Labs Lab 03/17/14 2000 03/18/14 0306 03/18/14 0800 03/19/14 0445 03/20/14 0450  WBC 7.7 7.6 7.6 4.9 3.8*  HGB 8.4* 8.2* 8.3* 8.2* 7.3*  HCT 24.1* 23.6* 23.8* 24.0* 21.7*  PLT 182 178 184 178 172  MCV 86.1 86.1 87.5 87.9 88.2  MCH 30.0 29.9 30.5 30.0 29.7  MCHC 34.9 34.7 34.9 34.2 33.6  RDW 15.9* 16.4* 16.3* 15.5 15.6*  LYMPHSABS  --  2.8  --   --   --   MONOABS  --  0.9  --   --   --   EOSABS  --  0.0  --   --   --   BASOSABS  --  0.0  --   --   --     Chemistries   Recent Labs Lab 03/16/14 1830 03/17/14 0530 03/18/14 0306 03/19/14 0445 03/20/14 0450  NA 144 141 143 142 138  K 4.1 3.8 3.7 2.8* 3.2*  CL 123* 116* 115* 109 109  CO2 18* GLUCOSE 181* 111* 120* 107* 104*  BUN CREATININE 1.01 0.93 0.86 0.72 0.76  CALCIUM 6.4* 7.1* 7.4* 7.6* 7.3*  MG 1.4* 1.5  --   --   --     CBG:  Recent Labs Lab 03/16/14 1716  GLUCAP 158*    GFR Estimated Creatinine Clearance: 122.4 mL/min (by C-G  formula based on Cr of 0.76).  Coagulation profile  Recent Labs Lab 03/17/14 0920  INR 1.34    Cardiac Enzymes  Recent Labs Lab 03/16/14 1830 03/16/14 2354 03/17/14 0530  TROPONINI 0.19* 0.35* 0.39*    Invalid input(s): POCBNP No results for input(s): DDIMER in the last 72 hours. No results for input(s): HGBA1C in the last 72 hours. No results for input(s): CHOL,  HDL, LDLCALC, TRIG, CHOLHDL, LDLDIRECT in the last 72 hours. No results for input(s): TSH, T4TOTAL, T3FREE, THYROIDAB in the last 72 hours.  Invalid input(s): FREET3 No results for input(s): VITAMINB12, FOLATE, FERRITIN, TIBC, IRON, RETICCTPCT in the last 72 hours. No results for input(s): LIPASE, AMYLASE in the last 72 hours.  Urine Studies No results for input(s): UHGB, CRYS in the last 72 hours.  Invalid input(s): UACOL, UAPR, USPG, UPH, UTP, UGL, UKET, UBIL, UNIT, UROB, ULEU, UEPI, UWBC, URBC, UBAC, CAST, UCOM, BILUA  MICROBIOLOGY: Recent Results (from the past 240 hour(s))  MRSA PCR Screening     Status: None   Collection Time: 03/16/14  5:45 PM  Result Value Ref Range Status   MRSA by PCR NEGATIVE NEGATIVE Final    Comment:        The GeneXpert MRSA Assay (FDA approved for NASAL specimens only), is one component of a comprehensive MRSA colonization surveillance program. It is not intended to diagnose MRSA infection nor to guide or monitor treatment for MRSA infections.   Culture, blood (routine x 2)     Status: None (Preliminary result)   Collection Time: 03/16/14  6:30 PM  Result Value Ref Range Status   Specimen Description BLOOD RIGHT ANTECUBITAL  Final   Special Requests BOTTLES DRAWN AEROBIC AND ANAEROBIC 5CC EA  Final   Culture   Final           BLOOD CULTURE RECEIVED NO GROWTH TO DATE CULTURE WILL BE HELD FOR 5 DAYS BEFORE ISSUING A FINAL NEGATIVE REPORT Performed at [facility]    Report Status PENDING  Incomplete  Culture, blood (routine x 2)     Status: None   Collection  Time: 03/16/14  6:35 PM  Result Value Ref Range Status   Specimen Description BLOOD RIGHT HAND  Final   Special Requests BOTTLES DRAWN AEROBIC AND ANAEROBIC 5CC EA  Final   Culture   Final    STREPTOCOCCUS SPECIES Note: RESEMBLING NUTRITIONALLY DEFICIENT STREPTOCOCCUS Standardized susceptibility testing for this organism is not available. Note: Gram Stain Report Called to,Read Back By and Verified With: REBECCA RN @917PM  VINCJ 03/17/14 Performed at [facility]    Report Status 03/18/2014 FINAL  Final  Clostridium Difficile by PCR     Status: Abnormal   Collection Time: 03/19/14  3:49 AM  Result Value Ref Range Status   C difficile by pcr POSITIVE (A) NEGATIVE Final    Comment: CRITICAL RESULT CALLED TO, READ BACK BY AND VERIFIED WITH: MILLS RN 10:30 03/19/14 ([email or web address])     RADIOLOGY STUDIES/RESULTS: Dg Chest Port 1 View  03/16/2014   CLINICAL DATA:  Central line placement, GI bleed  EXAM: PORTABLE CHEST - 1 VIEW  COMPARISON:  03/16/2014  FINDINGS: Cardiomediastinal silhouette is stable. Endotracheal tube in place with tip 4.6 cm above the carina. NG tube coiled within stomach with tip in distal stomach. There is right IJ central line with tip in distal SVC. No pneumothorax. No acute infiltrate or pulmonary edema.  IMPRESSION: NG tube and endotracheal tube in place. Right IJ central line in place. No pneumothorax.   Electronically Signed   By: Natasha Mead M.D.   On: 03/16/2014 19:24    Jeoffrey Massed, MD  [facility] Pager:[phone]  If 7PM-7AM, please contact night-coverage [email or web address] Password [email or web address] 03/20/2014, 3:23 PM   LOS: 4 days

## 2014-03-20 NOTE — Progress Notes (Addendum)
[facility] Progress Note    Since last GI note: BM this AM was more solid, dark but brown. Tolerating solid diet without vomiting or abd pains.    Objective: Vital signs in last 24 hours: Temp:  [98 F (36.7 C)-98.3 F (36.8 C)] 98.1 F (36.7 C) (01/27 0602) Pulse Rate:  [40-92] 70 (01/27 0602) Resp:  [12-20] 20 (01/27 0602) BP: (95-116)/(57-73) 114/73 mmHg (01/27 0602) SpO2:  [92 %-100 %] 100 % (01/27 0602) Last BM Date: 03/19/14 General: alert and oriented times 3 Heart: regular rate and rythm Abdomen: soft, non-tender, non-distended, normal bowel sounds   Lab Results:  Recent Labs  03/18/14 0800 03/19/14 0445 03/20/14 0450  WBC 7.6 4.9 3.8*  HGB 8.3* 8.2* 7.3*  PLT 184 178 172  MCV 87.5 87.9 88.2    Recent Labs  03/18/14 0306 03/19/14 0445 03/20/14 0450  NA 143 142 138  K 3.7 2.8* 3.2*  CL 115* 109 109  CO2 GLUCOSE 120* 107* 104*  BUN CREATININE 0.86 0.72 0.76  CALCIUM 7.4* 7.6* 7.3*    Recent Labs  03/19/14 0445  PROT 4.1*  ALBUMIN 1.8*  AST 13  ALT 12  ALKPHOS 39  BILITOT 0.4    Medications: Scheduled Meds: . metroNIDAZOLE  500 mg Oral 3 times per day  . nicotine  21 mg Transdermal Daily  . pantoprazole (PROTONIX) IV  40 mg Intravenous Q12H  . pneumococcal 23 valent vaccine  0.5 mL Intramuscular Tomorrow-1000  . sodium chloride  10-40 mL Intracatheter Q12H   Continuous Infusions:  PRN Meds:.HYDROmorphone (DILAUDID) injection, ondansetron (ZOFRAN) IV, sodium chloride    Assessment/Plan: 29 y.o. male with resolved UGI bleeding from large NSAID induced duodenal ulcer  OK to change to PO PPI today and if no overt bleeding he is safe to d/c tomorrow.  Will start him on once daily iron and he should continue this for at least 2-3 months.  He will need to follow up with Dr. Karilyn Cota in 6-8 weeks with CBC the day prior to make sure his blood counts recover. He should stay on twice daily PPI at least until that  appointment.  May be safe to decrease to once daily from there.  Obviously no more NSAIDs.     Rachael Fee, MD  03/20/2014, 9:28 AM [facility] Pager [phone]

## 2014-03-20 NOTE — Telephone Encounter (Signed)
Routing to Dr. Jerral Ralph to see Dr. Luvenia Starch response below:

## 2014-03-20 NOTE — Progress Notes (Signed)
ANTIBIOTIC CONSULT NOTE - INITIAL  Pharmacy Consult for vancomycin Indication: bacteremia  No Known Allergies  Patient Measurements: Height:  (172.7 cm) Weight: 142 lb 10.2 oz (64.7 kg) IBW/kg (Calculated) : 68.4   Vital Signs: Temp: 98.1 F (36.7 C) (01/27 1323) Temp Source: Oral (01/27 1323) BP: 117/75 mmHg (01/27 1323) Pulse Rate: 65 (01/27 1323) Intake/Output from previous day: 01/26 0701 - 01/27 0700 In: 835 [P.O.:460; I.V.:375] Out: -  Intake/Output from this shift: Total I/O In: 600 [P.O.:600] Out: -   Labs:  Recent Labs  03/18/14 0306 03/18/14 0800 03/19/14 0445 03/20/14 0450  WBC 7.6 7.6 4.9 3.8*  HGB 8.2* 8.3* 8.2* 7.3*  PLT 178 184 178 172  CREATININE 0.86  --  0.72 0.76   Estimated Creatinine Clearance: 122.4 mL/min (by C-G formula based on Cr of 0.76). No results for input(s): VANCOTROUGH, VANCOPEAK, VANCORANDOM, GENTTROUGH, GENTPEAK, GENTRANDOM, TOBRATROUGH, TOBRAPEAK, TOBRARND, AMIKACINPEAK, AMIKACINTROU, AMIKACIN in the last 72 hours.   Microbiology: Recent Results (from the past 720 hour(s))  MRSA PCR Screening     Status: None   Collection Time: 03/16/14  5:45 PM  Result Value Ref Range Status   MRSA by PCR NEGATIVE NEGATIVE Final    Comment:        The GeneXpert MRSA Assay (FDA approved for NASAL specimens only), is one component of a comprehensive MRSA colonization surveillance program. It is not intended to diagnose MRSA infection nor to guide or monitor treatment for MRSA infections.   Culture, blood (routine x 2)     Status: None (Preliminary result)   Collection Time: 03/16/14  6:30 PM  Result Value Ref Range Status   Specimen Description BLOOD RIGHT ANTECUBITAL  Final   Special Requests BOTTLES DRAWN AEROBIC AND ANAEROBIC 5CC EA  Final   Culture   Final           BLOOD CULTURE RECEIVED NO GROWTH TO DATE CULTURE WILL BE HELD FOR 5 DAYS BEFORE ISSUING A FINAL NEGATIVE REPORT Performed at [facility]    Report  Status PENDING  Incomplete  Culture, blood (routine x 2)     Status: None   Collection Time: 03/16/14  6:35 PM  Result Value Ref Range Status   Specimen Description BLOOD RIGHT HAND  Final   Special Requests BOTTLES DRAWN AEROBIC AND ANAEROBIC 5CC EA  Final   Culture   Final    STREPTOCOCCUS SPECIES Note: RESEMBLING NUTRITIONALLY DEFICIENT STREPTOCOCCUS Standardized susceptibility testing for this organism is not available. Note: Gram Stain Report Called to,Read Back By and Verified With: REBECCA RN  VINCJ 03/17/14 Performed at [facility]    Report Status 03/18/2014 FINAL  Final  Clostridium Difficile by PCR     Status: Abnormal   Collection Time: 03/19/14  3:49 AM  Result Value Ref Range Status   C difficile by pcr POSITIVE (A) NEGATIVE Final    Comment: CRITICAL RESULT CALLED TO, READ BACK BY AND VERIFIED WITH: MILLS RN 10:30 03/19/14 ([email or web address])     Medical History: Past Medical History  Diagnosis Date  . Crohn's     Medications:  Prescriptions prior to admission  Medication Sig Dispense Refill Last Dose  . Multiple Vitamins-Minerals (MULTIVITAMIN WITH MINERALS) tablet Take 1 tablet by mouth daily.   Past Week at Unknown time  . ciprofloxacin (CIPRO) 500 MG tablet Take 1 tablet (500 mg total) by mouth 2 (two) times daily. (Patient not taking: Reported on 03/18/2014) 20 tablet 0 Not Taking at Unknown time  .  metroNIDAZOLE (FLAGYL) 500 MG tablet Take 1 tablet (500 mg total) by mouth 2 (two) times daily. (Patient not taking: Reported on 03/18/2014) 20 tablet 0 Not Taking at Unknown time  . oxyCODONE-acetaminophen (PERCOCET) 7.5-325 MG per tablet Take 1-2 tablets by mouth every 4 (four) hours as needed. (Patient not taking: Reported on 03/18/2014) 50 tablet 0 Not Taking at Unknown time   Assessment: 29 yo M to start vancomycin for 1 of 2 BC with streptococcus species on 03/16/13.  Pt with complicated hx with recent intra-abdominal abscess, chron's colititis.  Dr. Drue Second of  ID advised to restart vancomycin, repeat BC, check ECHO.  ID to formally consult. WBC 3.8, creat 0.76, wt 65 kg.  vanc 1/13>>1/25 vanc 1/27>> cipro 1/23>>1/25 Flagyl 1/23>>   Goal of Therapy:  Vancomycin trough level 15-20 mcg/ml  Plan:  -vancomycin 1 gm IV q8h -f/u renal fxn, wbc, temp, culture data -check steady-state trough as needed  Herby Abraham, Pharm.D. [phone] 03/20/2014 5:10 PM

## 2014-03-21 DIAGNOSIS — B955 Unspecified streptococcus as the cause of diseases classified elsewhere: Secondary | ICD-10-CM

## 2014-03-21 DIAGNOSIS — R7881 Bacteremia: Secondary | ICD-10-CM

## 2014-03-21 DIAGNOSIS — R197 Diarrhea, unspecified: Secondary | ICD-10-CM

## 2014-03-21 DIAGNOSIS — K269 Duodenal ulcer, unspecified as acute or chronic, without hemorrhage or perforation: Secondary | ICD-10-CM

## 2014-03-21 DIAGNOSIS — A047 Enterocolitis due to Clostridium difficile: Secondary | ICD-10-CM

## 2014-03-21 DIAGNOSIS — K5090 Crohn's disease, unspecified, without complications: Secondary | ICD-10-CM

## 2014-03-21 LAB — CBC
HCT: 22.8 % — ABNORMAL LOW (ref 39.0–52.0)
Hemoglobin: 7.6 g/dL — ABNORMAL LOW (ref 13.0–17.0)
MCH: 29.7 pg (ref 26.0–34.0)
MCHC: 33.3 g/dL (ref 30.0–36.0)
MCV: 89.1 fL (ref 78.0–100.0)
Platelets: 233 10*3/uL (ref 150–400)
RBC: 2.56 MIL/uL — ABNORMAL LOW (ref 4.22–5.81)
RDW: 15.7 % — ABNORMAL HIGH (ref 11.5–15.5)
WBC: 4.7 10*3/uL (ref 4.0–10.5)

## 2014-03-21 LAB — BASIC METABOLIC PANEL
Anion gap: 4 — ABNORMAL LOW (ref 5–15)
BUN: 6 mg/dL (ref 6–23)
CO2: 26 mmol/L (ref 19–32)
Calcium: 7.5 mg/dL — ABNORMAL LOW (ref 8.4–10.5)
Chloride: 110 mmol/L (ref 96–112)
Creatinine, Ser: 0.76 mg/dL (ref 0.50–1.35)
GFR calc Af Amer: >90 mL/min (ref >90)
GFR calc non Af Amer: >90 mL/min (ref >90)
Glucose, Bld: 94 mg/dL (ref 70–99)
Potassium: 3.3 mmol/L — ABNORMAL LOW (ref 3.5–5.1)
Sodium: 140 mmol/L (ref 135–145)

## 2014-03-21 MED ORDER — HYDROMORPHONE HCL 1 MG/ML IJ SOLN
1.0000 mg | INTRAMUSCULAR | Status: DC | PRN
  Administered 2014-03-21 – 2014-03-22 (×4): 1 mg via INTRAVENOUS
  Filled 2014-03-21 (×4): qty 1

## 2014-03-21 MED ORDER — POTASSIUM CHLORIDE CRYS ER 20 MEQ PO TBCR
40.0000 meq | EXTENDED_RELEASE_TABLET | Freq: Once | ORAL | Status: AC
  Administered 2014-03-21: 40 meq via ORAL
  Filled 2014-03-21: qty 2

## 2014-03-21 MED ORDER — OXYCODONE HCL 5 MG PO TABS
10.0000 mg | ORAL_TABLET | Freq: Four times a day (QID) | ORAL | Status: DC | PRN
  Administered 2014-03-21 – 2014-03-22 (×5): 10 mg via ORAL
  Filled 2014-03-21 (×5): qty 2

## 2014-03-21 NOTE — Clinical Social Work Psychosocial (Addendum)
Clinical Social Work Department BRIEF PSYCHOSOCIAL ASSESSMENT 03/21/2014  Patient:  Shane Stone, Shane Stone     Account Number:  [id number]     Admit date:  03/16/2014  Clinical Social Worker:  Elouise Munroe  Date/Time:  03/21/2014 12:34 PM  Referred by:  Physician  Date Referred:  03/17/2014 Referred for  Other - See comment   Other Referral:   Medical noncompliance.   Interview type:  Patient Other interview type:    PSYCHOSOCIAL DATA Living Status:  FAMILY Admitted from facility:   Level of care:   Primary support name:  Shane Stone Primary support relationship to patient:  FAMILY Degree of support available:   Patient is independent with his needs, and he currently lives with his aunt because he can't afford his own place right now.    CURRENT CONCERNS Current Concerns  Other - See comment   Other Concerns:   Patient has difficulties with accessing appropriate medical care for his disease.  Patient does not have a steady job or mode of transportation.    SOCIAL WORK ASSESSMENT / PLAN Patient is a 29 year old male who is alert and oriented x4. Patient was talkative, and asked questions about how to see if he is eligible for disability and medicaid.  Explained to patient how he can apply for disability and medicaid becaues of his medical conditions.  Patient was given information about how to apply for disability.  Patient asked about medication vouchers to help get his medicine, CSW informed him that he can ask the case manager about medication assistance and if he had any other questions he can call CSW.   Assessment/plan status:  Other - See comment Other assessment/ plan:   Patient will try to start the disability application process while he is in hospital.   Information/referral to community resources:   Disability information and how to apply.    PATIENT'S/FAMILY'S RESPONSE TO PLAN OF CARE: Patient appreciative of information given.   Ervin Knack. Makinlee Awwad, MSW,  Theresia Majors [phone] 03/21/2014 12:46 PM

## 2014-03-21 NOTE — Consult Note (Signed)
[facility]  Total days of antibiotics 6        Day 6 metronidazole        Day 4 vancomycin (reinitiated on 1/27)        (3 days of cipro from 1/23-1/25)       Reason for Consult: bacteremia    Referring Physician: ghimire  Active Problems:   GIB (gastrointestinal bleeding)   Duodenal ulcer hemorrhagic   Hemorrhagic shock   Bacteremia due to Streptococcus    HPI: Shane Stone is a 29 y.o. male 29 yo man, hx of tobacco, Crohn's colitis dx in 2012 , who was hospitalized in the Fall of 2015 for intra-abd abscess drained percutaneously, discharged on cipro and metronidazole for treatment (Cx negativE).  He reports that roughly 2 weeks prior to admit, he had swelling in the right lower flank area where he previously had jp drain start to swell and have purulent drainage spontaneously. Denies having associated fever or chills with this episode. On day of admit, he reported having RUQ pain, nausea, light headedness and abrupt, large volume hematemesis. EMS brought him to OSH due to syncope andHgb on arrival was 7.1, and then he had more bloody emesis x 1, maroon BM x 2. He was started on protonix gtt and blood transfusions. He was transferred to [facility] for further management after receiving 4 units of RBC.  There was suspect for PUD/duodenal ulcer which was confirmed by EGD on 1/24, especially after he reported NSAID use for his pain. On day of admit, he also underwent infectious work up that revealed 1 of 2 blood cultures positive for nutritional deficient streptococcus. He also was tested for cdifficile on 1/26 which was positive. Unclear if patient had increasing diarrhea vs. Still having evidence of melenic stool. He has been on metronidazole. Patient states that he has chronic right lower quadrant pain from crohns as well as post prandial diarrhea as his baseline. He denies having melenic stools at this time. Noticing occ reflux  Past Medical History  Diagnosis Date    . Crohn's     Allergies: No Known Allergies  MEDICATIONS: . ferrous sulfate  325 mg Oral Q breakfast  . metroNIDAZOLE  500 mg Oral 3 times per day  . nicotine  21 mg Transdermal Daily  . pantoprazole  40 mg Oral BID AC  . pneumococcal 23 valent vaccine  0.5 mL Intramuscular Tomorrow-1000  . sodium chloride  10-40 mL Intracatheter Q12H  . vancomycin  1,000 mg Intravenous Q8H    History  Substance Use Topics  . Smoking status: Current Every Day Smoker -- 1.00 packs/day for 9 years    Types: Cigarettes  . Smokeless tobacco: Never Used  . Alcohol Use: No    Family History  Problem Relation Age of Onset  . Colon cancer Neg Hx   . Crohn's disease Neg Hx   . Inflammatory bowel disease Neg Hx     Review of Systems  Constitutional: Negative for fever, chills, diaphoresis, activity change, appetite change, fatigue and unexpected weight change.  HENT: Negative for congestion, sore throat, rhinorrhea, sneezing, trouble swallowing and sinus pressure.  Eyes: Negative for photophobia and visual disturbance.  Respiratory: Negative for cough, chest tightness, shortness of breath, wheezing and stridor.  Cardiovascular: Negative for chest pain, palpitations and leg swelling.  Gastrointestinal: positive for abd pain and diarrhea. Negative for nausea, vomiting, constipation, blood in stool, abdominal distention and anal bleeding.  Genitourinary: Negative for dysuria, hematuria,  flank pain and difficulty urinating.  Musculoskeletal: Negative for myalgias, back pain, joint swelling, arthralgias and gait problem.  Skin: Negative for color change, pallor, rash and wound.  Neurological: Negative for dizziness, tremors, weakness and light-headedness.  Hematological: Negative for adenopathy. Does not bruise/bleed easily.  Psychiatric/Behavioral: Negative for behavioral problems, confusion, sleep disturbance, dysphoric mood, decreased concentration and agitation.     OBJECTIVE: Temp:  [97.8 F  (36.6 C)-98.5 F (36.9 C)] 97.8 F (36.6 C) (01/28 0512) Pulse Rate:  [65-78] 74 (01/28 0512) Resp:  [17-20] 17 (01/28 0512) BP: (110-117)/(69-75) 110/69 mmHg (01/28 0512) SpO2:  [100 %] 100 % (01/28 0512) Physical Exam  Constitutional: He is oriented to person, place, and time. Pale appearance, fatigue,No distress.  HENT: right ij catheter in palce Mouth/Throat: Oropharynx is clear and moist. No oropharyngeal exudate.  Cardiovascular: Normal rate, regular rhythm and normal heart sounds. Exam reveals no gallop and no friction rub.  No murmur heard.  Pulmonary/Chest: Effort normal and breath sounds normal. No respiratory distress. He has no wheezes.  Abdominal: Soft. Bowel sounds are normal. He exhibits no distension. There is no tenderness. Right lower quadrant tenderness with mild palpation. Slight serous drainage from jp drain site Lymphadenopathy:  He has no cervical adenopathy.  Neurological: He is alert and oriented to person, place, and time.  Skin: Skin is warm and dry. No rash noted. No erythema.  Psychiatric: He has a normal mood and affect. His behavior is normal.     LABS: Results for orders placed or performed during the hospital encounter of 03/16/14 (from the past 48 hour(s))  BMET in AM     Status: Abnormal   Collection Time: 03/20/14  4:50 AM  Result Value Ref Range   Sodium 138 135 - 145 mmol/L   Potassium 3.2 (L) 3.5 - 5.1 mmol/L   Chloride 109 96 - 112 mmol/L   CO2 29 19 - 32 mmol/L   Glucose, Bld 104 (H) 70 - 99 mg/dL   BUN 6 6 - 23 mg/dL   Creatinine, Ser 0.76 0.50 - 1.35 mg/dL   Calcium 7.3 (L) 8.4 - 10.5 mg/dL   GFR calc non Af Amer >90 >90 mL/min   GFR calc Af Amer >90 >90 mL/min    Comment: (NOTE) The eGFR has been calculated using the CKD EPI equation. This calculation has not been validated in all clinical situations. eGFR's persistently <90 mL/min signify possible Chronic Kidney Disease.    Anion gap <5 (L) 5 - 15    Comment: CORRECTED ON  01/27 AT 0703: PREVIOUSLY REPORTED AS <3  CBC     Status: Abnormal   Collection Time: 03/20/14  4:50 AM  Result Value Ref Range   WBC 3.8 (L) 4.0 - 10.5 K/uL   RBC 2.46 (L) 4.22 - 5.81 MIL/uL   Hemoglobin 7.3 (L) 13.0 - 17.0 g/dL   HCT 21.7 (L) 39.0 - 52.0 %   MCV 88.2 78.0 - 100.0 fL   MCH 29.7 26.0 - 34.0 pg   MCHC 33.6 30.0 - 36.0 g/dL   RDW 15.6 (H) 11.5 - 15.5 %   Platelets 172 150 - 400 K/uL  CBC     Status: Abnormal   Collection Time: 03/21/14  7:14 AM  Result Value Ref Range   WBC 4.7 4.0 - 10.5 K/uL   RBC 2.56 (L) 4.22 - 5.81 MIL/uL   Hemoglobin 7.6 (L) 13.0 - 17.0 g/dL   HCT 22.8 (L) 39.0 - 52.0 %   MCV 89.1  78.0 - 100.0 fL   MCH 29.7 26.0 - 34.0 pg   MCHC 33.3 30.0 - 36.0 g/dL   RDW 15.7 (H) 11.5 - 15.5 %   Platelets 233 150 - 400 K/uL  Basic metabolic panel     Status: Abnormal   Collection Time: 03/21/14  7:14 AM  Result Value Ref Range   Sodium 140 135 - 145 mmol/L   Potassium 3.3 (L) 3.5 - 5.1 mmol/L   Chloride 110 96 - 112 mmol/L   CO2 26 19 - 32 mmol/L   Glucose, Bld 94 70 - 99 mg/dL   BUN 6 6 - 23 mg/dL   Creatinine, Ser 0.76 0.50 - 1.35 mg/dL   Calcium 7.5 (L) 8.4 - 10.5 mg/dL   GFR calc non Af Amer >90 >90 mL/min   GFR calc Af Amer >90 >90 mL/min    Comment: (NOTE) The eGFR has been calculated using the CKD EPI equation. This calculation has not been validated in all clinical situations. eGFR's persistently <90 mL/min signify possible Chronic Kidney Disease.    Anion gap 4 (L) 5 - 15    MICRO: 1/23 blood cx nutritional deficient strep 1 of 2 cx sets 1/27 blood cx pending 1/26 cdifficile IMAGING: No results found.  HISTORICAL MICRO/IMAGING  Assessment/Plan: 29yo M with crohns disease presents with massive upper gi bleed from duodenal ulcer but also found to have nutritionally deficient streptococcal bacteremia and possibly cdifficile   Streptococcal bacteremia = nutritionally deficient streptococcus is found part of our oral/gi tract.  Patient could possibly have translocated bacteria in the setting of gi bleeding to account of 1 of 2 positive blood cx sets. I would favor to treat with a 10 day course of vancomycin. Recommend to get TTE. If repeat blood cx from 1/27 are positive, then would need to get a TEE to rule out endocarditis. If blood cx are negative from 1/27 would change out his ij for picc line.  Clostridium difficile positive testing  = he did not recall having worsening diarrhea. likely colonization. Would not think that he needs treatment for it.  Health maintenance =would check for hiv  abd pain = defer to primary team, may need opiates. But has had relief with ice packs, that might be worth a try.

## 2014-03-21 NOTE — Progress Notes (Signed)
PATIENT DETAILS Name: Shane Stone Age: 29 y.o. Sex: male Date of Birth: March 19, 1985 Admit Date: 03/16/2014 Admitting Physician Lupita Leash, MD PCP:No PCP Per Patient  Subjective: Complains of abd pain, right sided chest pain. Was anxious to go home-but understands the need to stay  Assessment/Plan: Active Problems:   Hemorrhagic shock: resolved. Secondary to UGI bleed, from bleeding large duodenal ulcer.     GIB (gastrointestinal bleeding):Admitted, seen by GI- underwent EGD on 1/23 EGD> "giant" duodenoal bulb ulcer with massive bleeding clipped and epinephrine injected; hemostasis was achieved but was considered high risk of bleeding and was monitored closely in the ICU, general surgery was also consulted.Patient was continued on PPI. Thankfully GI bleeding seems to have resolved, PPI now changed to oral.  Patient instructed to avoid NSAID's in the future, likely this ulcer was NSAID induced.    Acute Blood Loss Anemia:Transfused multiple units of PRBC this admission. Hb slowly has drifted down to 7's range,suspect this is from re-equilibration rather than ongoing blood loss-as patient has brown stools. Hgb today 7.6. Will repeat CBC in am.    Streptococcal Bacteremia:1 out of 2 Blood cultures on 03/16/13 positive for nutritionally deficient Strep-complicated hx with recent Intraabdominal abscess, crohn's colitis. Spoke with Dr Drue Second, advised to restart IV Vancomycin. Waiting for repeat Blood cultures and Echo results. ID formally consulted. Discharge postponed until this is resolved.    Hypokalemia Potassium 2.8, 3.2 the past couple days and received potassium chloride. Today potassium is 3.3.  Give 40 mEq KDUR today. Recheck in tomorrow morning.    Acute Resp Failure:intubated for airway protection on admission, extubated on 1/25. Doing well,now on room air.    C Diff Colitis:diarrhea better, continue Flagyl-through 04/02/14    Hx intra-abd abscesses s/p drainage,  last 10/2013 : improved per 1/23 CT    Hx of Crohn's colitis:patient asked to follow up with primary GI in the next 2-3 weeks for follow up. I have asked patient to call and make an appointment.     Chronic Pain syndrome: avoid NSAID's.Likely secondary to above. Will start oral Oxy, and continue with IV Diluadid prn-but will slowly taper/minimize as much as possible.    Tobacco Abuse:counseled, continue transdermal Nicotine.  Disposition: Remain inpatient  Antibiotics: See below  Anti-infectives    Start     Dose/Rate Route Frequency Ordered Stop   03/20/14 1800  vancomycin (VANCOCIN) IVPB 1000 mg/200 mL premix     1,000 mg200 mL/hr over 60 Minutes Intravenous Every 8 hours 03/20/14 1703     03/19/14 1100  metroNIDAZOLE (FLAGYL) tablet 500 mg     500 mg Oral 3 times per day 03/19/14 1046 04/02/14 1359   03/16/14 2000  ciprofloxacin (CIPRO) IVPB 400 mg  Status:  Discontinued     400 mg200 mL/hr over 60 Minutes Intravenous Every 12 hours 03/16/14 1811 03/18/14 0949   03/16/14 2000  vancomycin (VANCOCIN) IVPB 1000 mg/200 mL premix  Status:  Discontinued     1,000 mg200 mL/hr over 60 Minutes Intravenous Every 8 hours 03/16/14 1811 03/18/14 0949   03/16/14 1900  erythromycin 500 mg in sodium chloride 0.9 % 100 mL IVPB  Status:  Discontinued     500 mg100 mL/hr over 60 Minutes Intravenous  Once 03/16/14 1815 03/16/14 2009   03/16/14 1800  metroNIDAZOLE (FLAGYL) IVPB 500 mg  Status:  Discontinued     500 mg100 mL/hr over 60 Minutes Intravenous Every 8 hours 03/16/14 1742 03/18/14 0949  DVT Prophylaxis:  SCD's  Code Status: Full code   Family Communication None at bedside  Procedures: 1/23 EGD  1/23 ETT  CONSULTS:  pulmonary/intensive care, GI and general surgery, ID  Time spent 25 minutes  MEDICATIONS: Scheduled Meds: . ferrous sulfate  325 mg Oral Q breakfast  . metroNIDAZOLE  500 mg Oral 3 times per day  . nicotine  21 mg Transdermal Daily  . pantoprazole  40 mg  Oral BID AC  . sodium chloride  10-40 mL Intracatheter Q12H  . vancomycin  1,000 mg Intravenous Q8H   Continuous Infusions:  PRN Meds:.HYDROmorphone (DILAUDID) injection, ondansetron (ZOFRAN) IV, sodium chloride    PHYSICAL EXAM: Vital signs in last 24 hours: Filed Vitals:   03/20/14 0602 03/20/14 1323 03/20/14 2147 03/21/14 0512  BP: 114/73 117/75 114/70 110/69  Pulse: 70 65 78 74  Temp: 98.1 F (36.7 C) 98.1 F (36.7 C) 98.5 F (36.9 C) 97.8 F (36.6 C)  TempSrc: Oral Oral Oral Oral  Resp: 20 20 17 17   Height:      Weight:      SpO2: 100% 100% 100% 100%    Weight change:  Filed Weights   03/16/14 1842 03/17/14 0500 03/18/14 0444  Weight: 62 kg (136 lb 11 oz) 65.5 kg (144 lb 6.4 oz) 64.7 kg (142 lb 10.2 oz)   Body mass index is 21.69 kg/(m^2).   Gen Exam: Awake and alert with clear speech, in mild distress from pain Neck: Supple, No JVD.   Chest: B/L Clear.   CVS: S1 S2 Regular, no murmurs.  Abdomen: soft, BS +, non tender, non distended.  Extremities: no edema, lower extremities warm to touch. Neurologic: Non Focal.    Skin: No Rash.  Several tattoos. Wounds: N/A.    Intake/Output from previous day:  Intake/Output Summary (Last 24 hours) at 03/21/14 1046 Last data filed at 03/21/14 1001  Gross per 24 hour  Intake    480 ml  Output      0 ml  Net    480 ml     LAB RESULTS: CBC  Recent Labs Lab 03/18/14 0306 03/18/14 0800 03/19/14 0445 03/20/14 0450 03/21/14 0714  WBC 7.6 7.6 4.9 3.8* 4.7  HGB 8.2* 8.3* 8.2* 7.3* 7.6*  HCT 23.6* 23.8* 24.0* 21.7* 22.8*  PLT 178 184 178 172 233  MCV 86.1 87.5 87.9 88.2 89.1  MCH 29.9 30.5 30.0 29.7 29.7  MCHC 34.7 34.9 34.2 33.6 33.3  RDW 16.4* 16.3* 15.5 15.6* 15.7*  LYMPHSABS 2.8  --   --   --   --   MONOABS 0.9  --   --   --   --   EOSABS 0.0  --   --   --   --   BASOSABS 0.0  --   --   --   --     Chemistries   Recent Labs Lab 03/16/14 1830 03/17/14 0530 03/18/14 0306 03/19/14 0445  03/20/14 0450 03/21/14 0714  NA 144 141 143 142 138 140  K 4.1 3.8 3.7 2.8* 3.2* 3.3*  CL 123* 116* 115* 109 109 110  CO2 18* 21 23 27 29 26   GLUCOSE 181* 111* 120* 107* 104* 94  BUN 21 20 20 8 6 6   CREATININE 1.01 0.93 0.86 0.72 0.76 0.76  CALCIUM 6.4* 7.1* 7.4* 7.6* 7.3* 7.5*  MG 1.4* 1.5  --   --   --   --     CBG:  Recent Labs Lab 03/16/14  1716  GLUCAP 158*    GFR Estimated Creatinine Clearance: 122.4 mL/min (by C-G formula based on Cr of 0.76).  Coagulation profile  Recent Labs Lab 03/17/14 0920  INR 1.34    Cardiac Enzymes  Recent Labs Lab 03/16/14 1830 03/16/14 2354 03/17/14 0530  TROPONINI 0.19* 0.35* 0.39*    Invalid input(s): POCBNP No results for input(s): DDIMER in the last 72 hours. No results for input(s): HGBA1C in the last 72 hours. No results for input(s): CHOL, HDL, LDLCALC, TRIG, CHOLHDL, LDLDIRECT in the last 72 hours. No results for input(s): TSH, T4TOTAL, T3FREE, THYROIDAB in the last 72 hours.  Invalid input(s): FREET3 No results for input(s): VITAMINB12, FOLATE, FERRITIN, TIBC, IRON, RETICCTPCT in the last 72 hours. No results for input(s): LIPASE, AMYLASE in the last 72 hours.  Urine Studies No results for input(s): UHGB, CRYS in the last 72 hours.  Invalid input(s): UACOL, UAPR, USPG, UPH, UTP, UGL, UKET, UBIL, UNIT, UROB, ULEU, UEPI, UWBC, URBC, UBAC, CAST, UCOM, BILUA  MICROBIOLOGY: Recent Results (from the past 240 hour(s))  MRSA PCR Screening     Status: None   Collection Time: 03/16/14  5:45 PM  Result Value Ref Range Status   MRSA by PCR NEGATIVE NEGATIVE Final    Comment:        The GeneXpert MRSA Assay (FDA approved for NASAL specimens only), is one component of a comprehensive MRSA colonization surveillance program. It is not intended to diagnose MRSA infection nor to guide or monitor treatment for MRSA infections.   Culture, blood (routine x 2)     Status: None (Preliminary result)   Collection Time:  03/16/14  6:30 PM  Result Value Ref Range Status   Specimen Description BLOOD RIGHT ANTECUBITAL  Final   Special Requests BOTTLES DRAWN AEROBIC AND ANAEROBIC 5CC EA  Final   Culture   Final           BLOOD CULTURE RECEIVED NO GROWTH TO DATE CULTURE WILL BE HELD FOR 5 DAYS BEFORE ISSUING A FINAL NEGATIVE REPORT Performed at [facility]    Report Status PENDING  Incomplete  Culture, blood (routine x 2)     Status: None   Collection Time: 03/16/14  6:35 PM  Result Value Ref Range Status   Specimen Description BLOOD RIGHT HAND  Final   Special Requests BOTTLES DRAWN AEROBIC AND ANAEROBIC 5CC EA  Final   Culture   Final    STREPTOCOCCUS SPECIES Note: RESEMBLING NUTRITIONALLY DEFICIENT STREPTOCOCCUS Standardized susceptibility testing for this organism is not available. Note: Gram Stain Report Called to,Read Back By and Verified With: REBECCA RN  VINCJ 03/17/14 Performed at [facility]    Report Status 03/18/2014 FINAL  Final  Clostridium Difficile by PCR     Status: Abnormal   Collection Time: 03/19/14  3:49 AM  Result Value Ref Range Status   C difficile by pcr POSITIVE (A) NEGATIVE Final    Comment: CRITICAL RESULT CALLED TO, READ BACK BY AND VERIFIED WITH: MILLS RN 10:30 03/19/14 ([email or web address])     RADIOLOGY STUDIES/RESULTS: Dg Chest Port 1 View  03/16/2014   CLINICAL DATA:  Central line placement, GI bleed  EXAM: PORTABLE CHEST - 1 VIEW  COMPARISON:  03/16/2014  FINDINGS: Cardiomediastinal silhouette is stable. Endotracheal tube in place with tip 4.6 cm above the carina. NG tube coiled within stomach with tip in distal stomach. There is right IJ central line with tip in distal SVC. No pneumothorax. No acute infiltrate or pulmonary edema.  IMPRESSION: NG tube and endotracheal tube in place. Right IJ central line in place. No pneumothorax.   Electronically Signed   By: Natasha Mead M.D.   On: 03/16/2014 19:24    Jamison Neighbor, PA-S  [facility] Pager:[phone]  [phone]  If 7PM-7AM, please contact night-coverage [email or web address] Password [email or web address] 03/21/2014, 10:46 AM   LOS: 5 days   Attending Patient was seen, examined,treatment plan was discussed with Ms Merril Abbe.  I have directly reviewed the clinical findings, lab, imaging studies and management of this patient in detail. I have made the necessary changes to the above noted documentation, and agree with the documentation, as recorded above  Windell Norfolk MD [facility].

## 2014-03-21 NOTE — Clinical Social Work Note (Signed)
CSW received referral for medical noncompliance and substance abuse.  Patient denies any substance abuse, but requested information about how to apply for disability and medicaid, information given to patient.  CSW to sign off please re-consult if social work needs arise.  Ervin Knack. Keimani Laufer, MSW, [employer] [phone]

## 2014-03-22 LAB — VANCOMYCIN, TROUGH: Vancomycin Tr: 14.6 ug/mL (ref 10.0–20.0)

## 2014-03-22 LAB — BASIC METABOLIC PANEL
Anion gap: 3 — ABNORMAL LOW (ref 5–15)
BUN: 7 mg/dL (ref 6–23)
CO2: 28 mmol/L (ref 19–32)
Calcium: 7.8 mg/dL — ABNORMAL LOW (ref 8.4–10.5)
Chloride: 110 mmol/L (ref 96–112)
Creatinine, Ser: 0.84 mg/dL (ref 0.50–1.35)
GFR calc Af Amer: >90 mL/min (ref >90)
GFR calc non Af Amer: >90 mL/min (ref >90)
Glucose, Bld: 96 mg/dL (ref 70–99)
Potassium: 3.4 mmol/L — ABNORMAL LOW (ref 3.5–5.1)
Sodium: 141 mmol/L (ref 135–145)

## 2014-03-22 LAB — CBC WITH DIFFERENTIAL/PLATELET
Basophils Absolute: 0 10*3/uL (ref 0.0–0.1)
Basophils Relative: 1 % (ref 0–1)
Eosinophils Absolute: 0.1 10*3/uL (ref 0.0–0.7)
Eosinophils Relative: 1 % (ref 0–5)
HCT: 23.6 % — ABNORMAL LOW (ref 39.0–52.0)
Hemoglobin: 7.9 g/dL — ABNORMAL LOW (ref 13.0–17.0)
Lymphocytes Relative: 31 % (ref 12–46)
Lymphs Abs: 1.7 10*3/uL (ref 0.7–4.0)
MCH: 29.8 pg (ref 26.0–34.0)
MCHC: 33.5 g/dL (ref 30.0–36.0)
MCV: 89.1 fL (ref 78.0–100.0)
Monocytes Absolute: 0.4 10*3/uL (ref 0.1–1.0)
Monocytes Relative: 7 % (ref 3–12)
Neutro Abs: 3.3 10*3/uL (ref 1.7–7.7)
Neutrophils Relative %: 60 % (ref 43–77)
Platelets: 276 10*3/uL (ref 150–400)
RBC: 2.65 MIL/uL — ABNORMAL LOW (ref 4.22–5.81)
RDW: 15.8 % — ABNORMAL HIGH (ref 11.5–15.5)
WBC: 5.4 10*3/uL (ref 4.0–10.5)

## 2014-03-22 MED ORDER — PANTOPRAZOLE SODIUM 40 MG PO TBEC: 40.0000 mg | DELAYED_RELEASE_TABLET | Freq: Two times a day (BID) | ORAL | Status: DC

## 2014-03-22 MED ORDER — VANCOMYCIN HCL 10 G IV SOLR
1500.0000 mg | Freq: Two times a day (BID) | INTRAVENOUS | Status: DC
  Filled 2014-03-22: qty 1500

## 2014-03-22 MED ORDER — OXYCODONE HCL 10 MG PO TABS: 10.0000 mg | ORAL_TABLET | Freq: Four times a day (QID) | ORAL | Status: DC | PRN

## 2014-03-22 MED ORDER — OMEPRAZOLE 40 MG PO CPDR: 40.0000 mg | DELAYED_RELEASE_CAPSULE | Freq: Two times a day (BID) | ORAL | Status: DC

## 2014-03-22 MED ORDER — FERROUS SULFATE 325 (65 FE) MG PO TABS: 325.0000 mg | ORAL_TABLET | Freq: Every day | ORAL | Status: DC

## 2014-03-22 MED ORDER — VANCOMYCIN HCL IN DEXTROSE 1-5 GM/200ML-% IV SOLN: 1000.0000 mg | Freq: Three times a day (TID) | INTRAVENOUS | Status: DC

## 2014-03-22 MED ORDER — POTASSIUM CHLORIDE CRYS ER 20 MEQ PO TBCR
40.0000 meq | EXTENDED_RELEASE_TABLET | Freq: Once | ORAL | Status: AC
  Administered 2014-03-22: 40 meq via ORAL
  Filled 2014-03-22: qty 2

## 2014-03-22 MED ORDER — SODIUM CHLORIDE 0.9 % IJ SOLN
10.0000 mL | INTRAMUSCULAR | Status: DC | PRN
  Administered 2014-03-22: 10 mL
  Filled 2014-03-22: qty 40

## 2014-03-22 MED ORDER — HEPARIN SOD (PORK) LOCK FLUSH 100 UNIT/ML IV SOLN
250.0000 [IU] | INTRAVENOUS | Status: AC | PRN
  Administered 2014-03-22: 250 [IU]

## 2014-03-22 NOTE — Progress Notes (Signed)
  Echocardiogram 2D Echocardiogram has been performed.  Arvil Chaco 03/22/2014, 9:59 AM

## 2014-03-22 NOTE — Progress Notes (Signed)
Pt arranged with [facility] for [facility] and IV abx at home.  He has previously used the [facility] program for medication assistance in September 2015 and is inegligible until September 2016.  Assigned RN is contacting attending MD to see if there is another recommendation that is more affordable for patient than Protonix as it is ~$200 at [employer] [facility].  Pt reports that he has just enough to fill his Oxycodone Rx.  Carlyle Lipa, RN BSN MHA CCM  Case Manager, Trauma Service/Unit [facility] [phone].

## 2014-03-22 NOTE — Progress Notes (Signed)
Peripherally Inserted Central Catheter/Midline Placement  The IV Nurse has discussed with the patient and/or persons authorized to consent for the patient, the purpose of this procedure and the potential benefits and risks involved with this procedure.  The benefits include less needle sticks, lab draws from the catheter and patient may be discharged home with the catheter.  Risks include, but not limited to, infection, bleeding, blood clot (thrombus formation), and puncture of an artery; nerve damage and irregular heat beat.  Alternatives to this procedure were also discussed.  PICC/Midline Placement Documentation        Shane Stone 03/22/2014, 11:40 AM

## 2014-03-22 NOTE — Progress Notes (Signed)
  Outpatient Parenteral Antimicrobial Therapy (OPAT) Pharmacy Monitoring  Guidelines developed by the Infectious Diseases Society of America (IDSA) recommend that hospitals who send patients home on intravenous (IV) antibiotics have an active performance improvement program.  IDSA guidelines also recommend that an infection specialist, such as a pharmacist, be involved in the evaluation of candidates for discharge with OPAT.      Yes No Comments  Assessed patient for appropriateness of OPAT? [x]  []  Is IV antimicrobial therapy needed? Is the home environment safe and adequate to support care? Are the patient/caregivers willing to participate and able to safely, effectively, and reliably deliver parenteral antimicrobial therapy? Is communication about problems and monitoring of therapy in place with patient? Does the patient/caregiver understand the benefits/risks with OPAT?  Are oral antibiotics an option? []  [x]  Patients diagnosed with endocarditis are not candidates for oral antibiotics.  Is antibiotic selected the best option for treatment indication? [x]  []  Can therapy be de-escalated further?  Is the dose appropriate? []  [x]  Consider renal function and administration schedule for outpatient setting.  Is the length of therapy appropriate? [x]  []  Consider disease-specific guidelines when available.  Recommend changing antibiotic regimen? [x]  []  If yes, recommend vancomycin 1,500 mg IV q12h.    Final antibiotic regimen for discharge is vancomycin 1,500 mg IV q12h for 7 more days (start date 03/20/14) for a total of 10 days of therapy (include drug, dose, route, frequency and duration).  Laboratory monitoring and follow-up for discharge with OPAT is vancomycin trough and BMET once weekly. The first vancomycin trough is to be drawn around Css of the new dose (starts tomorrow at 8:00am).  Please draw vancomycin trough on Sunday 03/24/14 before the 4th dose around 7:30pm.   Thank you for allowing  pharmacy to be a part of this patient's care team.  Dondra Rhett L. Roseanne Reno, PharmD Clinical Pharmacy Resident Pager: [phone] 03/22/2014 1:34 PM

## 2014-03-22 NOTE — Discharge Summary (Signed)
PATIENT DETAILS Name: Shane Stone Age: 29 y.o. Sex: male Date of Birth: [birth year] MRN: [id number]. Admitting Physician: Lupita Leash, MD PCP:No PCP Per Patient  Admit Date: 03/16/2014 Discharge date: 03/22/2014  Recommendations for Outpatient Follow-up:  1. Please check weekly CBC, chemistries and Vanco trough level every week.  2. We will need to establish care with PCP and gastroenterologist.    PRIMARY DISCHARGE DIAGNOSIS:  Active Problems:   GIB (gastrointestinal bleeding)   Duodenal ulcer hemorrhagic   Hemorrhagic shock   Bacteremia due to Streptococcus      PAST MEDICAL HISTORY: Past Medical History  Diagnosis Date  . Crohn's     DISCHARGE MEDICATIONS: Current Discharge Medication List    START taking these medications   Details  ferrous sulfate 325 (65 FE) MG tablet Take 1 tablet (325 mg total) by mouth daily with breakfast. Qty: 60 tablet, Refills: 0    oxyCODONE 10 MG TABS Take 1 tablet (10 mg total) by mouth every 6 (six) hours as needed for moderate pain. Qty: 30 tablet, Refills: 0    pantoprazole (PROTONIX) 40 MG tablet Take 1 tablet (40 mg total) by mouth 2 (two) times daily before a meal. Qty: 120 tablet, Refills: 0    vancomycin (VANCOCIN) 1 GM/200ML SOLN Inject 200 mLs (1,000 mg total) into the vein every 8 (eight) hours. For 10 days from 03/22/14 Qty: 6000 mL, Refills: 0      STOP taking these medications     Multiple Vitamins-Minerals (MULTIVITAMIN WITH MINERALS) tablet      ciprofloxacin (CIPRO) 500 MG tablet      metroNIDAZOLE (FLAGYL) 500 MG tablet      oxyCODONE-acetaminophen (PERCOCET) 7.5-325 MG per tablet         ALLERGIES:  No Known Allergies  BRIEF HPI:  See H&P, Labs, Consult and Test reports for all details in brief, patient is 29 y.o. Caucasian male with PMH of Crohn's, narcotic use, tobacco use, significant NSAID use, and medical non-compliance. On 1/23 he experienced nausea, pain, vomited bright red blood, and  was taken to [facility] where he had more bloody emesis and maroon BM. He was hypotensive, received 4 units PRBC and was transferred to [facility], PCCM admitted to ICU. Pt received central venous catheter, intubation, GI performed emergent EGD and found giant duodenal bulb ulcer with massive bleeding, vessels were clipped and injected and patient made hemodynamically stable. He was extubated on 1/25 and remained stable; was on PPI, . He experienced no further bleeds. CLO test was negative. Ulcer likely due to high NSAID use.  On 1/24 his blood cultures came back positive, identified as nutritionally deficient streptococcus; treated with Vancomycin.  1/26 C. Diff positive, Flagyl was started and he was placed on enteric precautions.  CONSULTATIONS:   pulmonary/intensive care, ID and GI  PERTINENT RADIOLOGIC STUDIES: Dg Chest Port 1 View  03/16/2014   CLINICAL DATA:  Central line placement, GI bleed  EXAM: PORTABLE CHEST - 1 VIEW  COMPARISON:  03/16/2014  FINDINGS: Cardiomediastinal silhouette is stable. Endotracheal tube in place with tip 4.6 cm above the carina. NG tube coiled within stomach with tip in distal stomach. There is right IJ central line with tip in distal SVC. No pneumothorax. No acute infiltrate or pulmonary edema.  IMPRESSION: NG tube and endotracheal tube in place. Right IJ central line in place. No pneumothorax.   Electronically Signed   By: Natasha Mead M.D.   On: 03/16/2014 19:24     PERTINENT LAB RESULTS: CBC:  Recent Labs  03/21/14 0714 03/22/14 0510  WBC 4.7 5.4  HGB 7.6* 7.9*  HCT 22.8* 23.6*  PLT 233 276   CMET CMP     Component Value Date/Time   NA 141 03/22/2014 0510   K 3.4* 03/22/2014 0510   CL 110 03/22/2014 0510   CO2 28 03/22/2014 0510   GLUCOSE 96 03/22/2014 0510   BUN 7 03/22/2014 0510   CREATININE 0.84 03/22/2014 0510   CALCIUM 7.8* 03/22/2014 0510   PROT 4.1* 03/19/2014 0445   ALBUMIN 1.8* 03/19/2014 0445   AST 13 03/19/2014 0445   ALT 12 03/19/2014  0445   ALKPHOS 39 03/19/2014 0445   BILITOT 0.4 03/19/2014 0445   GFRNONAA >90 03/22/2014 0510   GFRAA >90 03/22/2014 0510    GFR Estimated Creatinine Clearance: 116.6 mL/min (by C-G formula based on Cr of 0.84). No results for input(s): LIPASE, AMYLASE in the last 72 hours. No results for input(s): CKTOTAL, CKMB, CKMBINDEX, TROPONINI in the last 72 hours. Invalid input(s): POCBNP No results for input(s): DDIMER in the last 72 hours. No results for input(s): HGBA1C in the last 72 hours. No results for input(s): CHOL, HDL, LDLCALC, TRIG, CHOLHDL, LDLDIRECT in the last 72 hours. No results for input(s): TSH, T4TOTAL, T3FREE, THYROIDAB in the last 72 hours.  Invalid input(s): FREET3 No results for input(s): VITAMINB12, FOLATE, FERRITIN, TIBC, IRON, RETICCTPCT in the last 72 hours. Coags: No results for input(s): INR in the last 72 hours.  Invalid input(s): PT Microbiology: Recent Results (from the past 240 hour(s))  MRSA PCR Screening     Status: None   Collection Time: 03/16/14  5:45 PM  Result Value Ref Range Status   MRSA by PCR NEGATIVE NEGATIVE Final    Comment:        The GeneXpert MRSA Assay (FDA approved for NASAL specimens only), is one component of a comprehensive MRSA colonization surveillance program. It is not intended to diagnose MRSA infection nor to guide or monitor treatment for MRSA infections.   Culture, blood (routine x 2)     Status: None (Preliminary result)   Collection Time: 03/16/14  6:30 PM  Result Value Ref Range Status   Specimen Description BLOOD RIGHT ANTECUBITAL  Final   Special Requests BOTTLES DRAWN AEROBIC AND ANAEROBIC 5CC EA  Final   Culture   Final           BLOOD CULTURE RECEIVED NO GROWTH TO DATE CULTURE WILL BE HELD FOR 5 DAYS BEFORE ISSUING A FINAL NEGATIVE REPORT Performed at [facility]    Report Status PENDING  Incomplete  Culture, blood (routine x 2)     Status: None   Collection Time: 03/16/14  6:35 PM  Result  Value Ref Range Status   Specimen Description BLOOD RIGHT HAND  Final   Special Requests BOTTLES DRAWN AEROBIC AND ANAEROBIC 5CC EA  Final   Culture   Final    STREPTOCOCCUS SPECIES Note: RESEMBLING NUTRITIONALLY DEFICIENT STREPTOCOCCUS Standardized susceptibility testing for this organism is not available. Note: Gram Stain Report Called to,Read Back By and Verified With: REBECCA RN @917PM  VINCJ 03/17/14 Performed at [facility]    Report Status 03/18/2014 FINAL  Final  Clostridium Difficile by PCR     Status: Abnormal   Collection Time: 03/19/14  3:49 AM  Result Value Ref Range Status   C difficile by pcr POSITIVE (A) NEGATIVE Final    Comment: CRITICAL RESULT CALLED TO, READ BACK BY AND VERIFIED WITH: MILLS RN  10:30 03/19/14 ([email or web address])   Culture, blood (routine x 2)     Status: None (Preliminary result)   Collection Time: 03/20/14  7:21 PM  Result Value Ref Range Status   Specimen Description BLOOD LEFT HAND  Final   Special Requests BOTTLES DRAWN AEROBIC ONLY 10 CC  Final   Culture   Final           BLOOD CULTURE RECEIVED NO GROWTH TO DATE CULTURE WILL BE HELD FOR 5 DAYS BEFORE ISSUING A FINAL NEGATIVE REPORT Performed at [facility]    Report Status PENDING  Incomplete  Culture, blood (routine x 2)     Status: None (Preliminary result)   Collection Time: 03/20/14  7:32 PM  Result Value Ref Range Status   Specimen Description BLOOD RIGHT HAND  Final   Special Requests BOTTLES DRAWN AEROBIC ONLY 8 CC  Final   Culture   Final           BLOOD CULTURE RECEIVED NO GROWTH TO DATE CULTURE WILL BE HELD FOR 5 DAYS BEFORE ISSUING A FINAL NEGATIVE REPORT Performed at [facility]    Report Status PENDING  Incomplete     BRIEF HOSPITAL COURSE:    Hemorrhagic shock: Resolved. Secondary to UGI bleed, from bleeding large duodenal ulcer.    GIB (gastrointestinal bleeding):Admitted, seen by GI- underwent EGD on 1/23 EGD> "giant" duodenoal bulb ulcer with massive  bleeding clipped and epinephrine injected; hemostasis was achieved but was considered high risk of bleeding and was monitored closely in the ICU, general surgery was also consulted.Patient was continued on PPI. Thankfully GI bleeding hasresolved, PPI now changed to oral. Patient instructed to avoid NSAID's in the future, likely this ulcer was NSAID induced.   Acute Blood Loss Anemia:Transfused multiple units of PRBC this admission. Hb slowly has drifted down to 7's range,suspect this is from re-equilibration rather than ongoing blood loss-as patient has brown stools. Hgb today 7.9 on discharge. Will need repeat CBC in the next 4-6 weeks   Streptococcal Bacteremia:1 out of 2 Blood cultures on 03/16/13 positive for nutritionally deficient Strep-complicated hx with recent Intraabdominal abscess, crohn's colitis. Seen in consultation with infectious disease, blood cultures repeated on 03/20/14 which is negative. Transthoracic echocardiogram negative for vegetations. This M.D. spoke with infectious disease -Dr Drue Second, recommendations are 10 days of IV Vancomycin from 03/22/14.Per infectious disease no need for further investigations at this time. Subsequently, PICC line placed, home health RN arranged for IV vancomycin.   Acute Resp Failure:intubated for airway protection on admission, extubated on 1/25. Doing well,now on room air.   Clostridium difficile positive testing: Per infectious disease, this is mostly colonization rather than a true infection. No need for continuing Flagyl at this time.   Hx intra-abd abscesses s/p drainage, last 10/2013 : improved per 1/23 CT. Has chronic abdominal pain, will discharge on oral narcotics.   Hx of Crohn's colitis:patient asked to follow up with primary GI in the next 2-3 weeks for follow up. I have asked patient to call and make an appointment.    Chronic Pain syndrome: avoid NSAID's.Likely secondary to above. Will provide patient supply of oxycodone, to  reestablish his care with family M.D. or primary gastroenterologist.    Tobacco Abuse:counseled  TODAY-DAY OF DISCHARGE:  Subjective:   Shane Stone today has no headache,no chest pain, mild lower abdominal pain that he reports is baseline, new weakness tingling or numbness, feels much better.   Objective:   Blood pressure 113/75, pulse 75, temperature 98.4  F (36.9 C), temperature source Oral, resp. rate 16, height 5\' 8"  (1.727 m), weight 64.7 kg (142 lb 10.2 oz), SpO2 99 %.  Intake/Output Summary (Last 24 hours) at 03/22/14 1312 Last data filed at 03/22/14 1016  Gross per 24 hour  Intake    240 ml  Output      0 ml  Net    240 ml   Filed Weights   03/16/14 1842 03/17/14 0500 03/18/14 0444  Weight: 62 kg (136 lb 11 oz) 65.5 kg (144 lb 6.4 oz) 64.7 kg (142 lb 10.2 oz)    Exam Awake Alert, Oriented *3, No new F.N deficits, Normal affect [place].AT,PERRAL Supple Neck,No JVD, No cervical lymphadenopathy appriciated.  Symmetrical Chest wall movement, Good air movement bilaterally, CTAB RRR,No Gallops,Rubs or new Murmurs, No Parasternal Heave +ve B.Sounds, Abd Soft, , No organomegaly appriciated, No rebound -guarding or rigidity. lower abdomen mildly tender No Cyanosis, Clubbing or edema, No new Rash or bruise  DISCHARGE CONDITION: Stable  DISPOSITION: Home with home health services  DISCHARGE INSTRUCTIONS:    Activity:  As tolerated   Diet recommendation: Regular Diet   Discharge Instructions    Call MD for:  severe uncontrolled pain    Complete by:  As directed      Call MD for:    Complete by:  As directed   Bloody stools, black tarry stools or blood in vomitus     Diet general    Complete by:  As directed      Discharge instructions    Complete by:  As directed   No NSAIDs-no Advil, Motrin, ibuprofen, aspirin. If you haven, take Tylenol or, get a refill on oxycodone.     Increase activity slowly    Complete by:  As directed            Follow-up  Information    Schedule an appointment as soon as possible for a visit in 1 week to follow up.   Contact information:   Primary care practitioner      Schedule an appointment as soon as possible for a visit in 2 weeks to follow up.   Contact information:   Gastroenterologist of your choice      Total Time spent on discharge equals 45 minutes.  SignedJeoffrey Massed, PA-S 03/22/2014 1:12 PM

## 2014-03-22 NOTE — Discharge Instructions (Signed)
No NSAIDs-no Advil, Motrin, ibuprofen, aspirin. If you have pain, take Tylenol or, or get a refill on oxycodone.

## 2014-03-22 NOTE — Progress Notes (Addendum)
ANTIBIOTIC CONSULT NOTE - FOLLOW UP  Pharmacy Consult for Vancomycin Indication: Streptococcus Bacteremia  No Known Allergies  Patient Measurements: Height: 5\' 8"  (172.7 cm) Weight: 142 lb 10.2 oz (64.7 kg) IBW/kg (Calculated) : 68.4  Vital Signs: Temp: 98.4 F (36.9 C) (01/29 0558) Temp Source: Oral (01/29 0558) BP: 113/75 mmHg (01/29 0558) Pulse Rate: 75 (01/29 0558) Intake/Output from previous day: 01/28 0701 - 01/29 0700 In: 1080 [P.O.:480; IV Piggyback:600] Out: -  Intake/Output from this shift: Total I/O In: 240 [P.O.:240] Out: -   Labs:  Recent Labs  03/20/14 0450 03/21/14 0714 03/22/14 0510  WBC 3.8* 4.7 5.4  HGB 7.3* 7.6* 7.9*  PLT 172 233 276  CREATININE 0.76 0.76 0.84   Estimated Creatinine Clearance: 116.6 mL/min (by C-G formula based on Cr of 0.84). No results for input(s): VANCOTROUGH, VANCOPEAK, VANCORANDOM, GENTTROUGH, GENTPEAK, GENTRANDOM, TOBRATROUGH, TOBRAPEAK, TOBRARND, AMIKACINPEAK, AMIKACINTROU, AMIKACIN in the last 72 hours.   Microbiology: Recent Results (from the past 720 hour(s))  MRSA PCR Screening     Status: None   Collection Time: 03/16/14  5:45 PM  Result Value Ref Range Status   MRSA by PCR NEGATIVE NEGATIVE Final    Comment:        The GeneXpert MRSA Assay (FDA approved for NASAL specimens only), is one component of a comprehensive MRSA colonization surveillance program. It is not intended to diagnose MRSA infection nor to guide or monitor treatment for MRSA infections.   Culture, blood (routine x 2)     Status: None (Preliminary result)   Collection Time: 03/16/14  6:30 PM  Result Value Ref Range Status   Specimen Description BLOOD RIGHT ANTECUBITAL  Final   Special Requests BOTTLES DRAWN AEROBIC AND ANAEROBIC 5CC EA  Final   Culture   Final           BLOOD CULTURE RECEIVED NO GROWTH TO DATE CULTURE WILL BE HELD FOR 5 DAYS BEFORE ISSUING A FINAL NEGATIVE REPORT Performed at [facility]    Report Status  PENDING  Incomplete  Culture, blood (routine x 2)     Status: None   Collection Time: 03/16/14  6:35 PM  Result Value Ref Range Status   Specimen Description BLOOD RIGHT HAND  Final   Special Requests BOTTLES DRAWN AEROBIC AND ANAEROBIC 5CC EA  Final   Culture   Final    STREPTOCOCCUS SPECIES Note: RESEMBLING NUTRITIONALLY DEFICIENT STREPTOCOCCUS Standardized susceptibility testing for this organism is not available. Note: Gram Stain Report Called to,Read Back By and Verified With: REBECCA RN @917PM  VINCJ 03/17/14 Performed at [facility]    Report Status 03/18/2014 FINAL  Final  Clostridium Difficile by PCR     Status: Abnormal   Collection Time: 03/19/14  3:49 AM  Result Value Ref Range Status   C difficile by pcr POSITIVE (A) NEGATIVE Final    Comment: CRITICAL RESULT CALLED TO, READ BACK BY AND VERIFIED WITH: MILLS RN 10:30 03/19/14 ([email or web address])   Culture, blood (routine x 2)     Status: None (Preliminary result)   Collection Time: 03/20/14  7:21 PM  Result Value Ref Range Status   Specimen Description BLOOD LEFT HAND  Final   Special Requests BOTTLES DRAWN AEROBIC ONLY 10 CC  Final   Culture   Final           BLOOD CULTURE RECEIVED NO GROWTH TO DATE CULTURE WILL BE HELD FOR 5 DAYS BEFORE ISSUING A FINAL NEGATIVE REPORT Performed at [facility]  Report Status PENDING  Incomplete  Culture, blood (routine x 2)     Status: None (Preliminary result)   Collection Time: 03/20/14  7:32 PM  Result Value Ref Range Status   Specimen Description BLOOD RIGHT HAND  Final   Special Requests BOTTLES DRAWN AEROBIC ONLY 8 CC  Final   Culture   Final           BLOOD CULTURE RECEIVED NO GROWTH TO DATE CULTURE WILL BE HELD FOR 5 DAYS BEFORE ISSUING A FINAL NEGATIVE REPORT Performed at [facility]    Report Status PENDING  Incomplete    Anti-infectives    Start     Dose/Rate Route Frequency Ordered Stop   03/20/14 1800  vancomycin (VANCOCIN) IVPB 1000 mg/200 mL  premix     1,000 mg200 mL/hr over 60 Minutes Intravenous Every 8 hours 03/20/14 1703     03/19/14 1100  metroNIDAZOLE (FLAGYL) tablet 500 mg     500 mg Oral 3 times per day 03/19/14 1046 04/02/14 1359   03/16/14 2000  ciprofloxacin (CIPRO) IVPB 400 mg  Status:  Discontinued     400 mg200 mL/hr over 60 Minutes Intravenous Every 12 hours 03/16/14 1811 03/18/14 0949   03/16/14 2000  vancomycin (VANCOCIN) IVPB 1000 mg/200 mL premix  Status:  Discontinued     1,000 mg200 mL/hr over 60 Minutes Intravenous Every 8 hours 03/16/14 1811 03/18/14 0949   03/16/14 1900  erythromycin 500 mg in sodium chloride 0.9 % 100 mL IVPB  Status:  Discontinued     500 mg100 mL/hr over 60 Minutes Intravenous  Once 03/16/14 1815 03/16/14 2009   03/16/14 1800  metroNIDAZOLE (FLAGYL) IVPB 500 mg  Status:  Discontinued     500 mg100 mL/hr over 60 Minutes Intravenous Every 8 hours 03/16/14 1742 03/18/14 0949      Assessment: 29 YOM who continues on Vancomycin D#3 for nutritional deficient streptococcus bacteremia thought to have translocated from the gut. ID on board for work-up and currently recommending 10 days of treatment pending TTE negative. ECHO done this AM - results not yet posted.  A Vancomycin trough this morning resulted as almost therapeutic (Vanc trough 14.6 mcg/ml, goal of 15-20 mcg/ml). Renal function has remained stable, SCr 0.84, CrCl~100 ml/min. Since trough so close to goal range - will hold off on adjusting dose for now since the patient will likely have a little more accumulation and get within the goal range.  Goal of Therapy:  Vancomycin trough level 15-20 mcg/ml  Plan:  1. Continue Vancomycin 1g IV every 8 hours 2. Will continue to follow renal function, culture results, LOT, and antibiotic de-escalation plans   Georgina Pillion, PharmD, BCPS Clinical Pharmacist Pager: [phone] 03/22/2014 11:10 AM    Discussed patient's case with Georgina Pillion, PharmD, BCPS and [facility].  Will  change patient to 1,500 mg IV q12h for ease of administration at home.  Recommended a trough to be drawn around [place] (Sunday night).  Cassie L. Roseanne Reno, PharmD Clinical Pharmacy Resident Pager: [phone] 03/22/2014 1:29 PM

## 2014-03-23 LAB — CULTURE, BLOOD (ROUTINE X 2): Culture: NO GROWTH

## 2014-03-25 ENCOUNTER — Telehealth: Payer: Self-pay | Admitting: Licensed Clinical Social Worker

## 2014-03-25 LAB — HIV ANTIBODY (ROUTINE TESTING W REFLEX): HIV Screen 4th Generation wRfx: NONREACTIVE

## 2014-03-25 NOTE — Telephone Encounter (Signed)
Per Dr. Drue Second goal is 15 for trough, Larita Fife is going to increase Vanc and send out to the patient today.

## 2014-03-25 NOTE — Telephone Encounter (Signed)
Larita Fife from [facility] called wanting to get a trough level goal for this patient that is on IV Vanc for 7 days. Patient started on 03/23/14, and is currently getting 1500 Q 12.  Last trough level was 11.4, please advise

## 2014-03-27 LAB — CULTURE, BLOOD (ROUTINE X 2)
Culture: NO GROWTH
Culture: NO GROWTH

## 2015-05-07 ENCOUNTER — Ambulatory Visit: Payer: Self-pay | Admitting: Family Medicine

## 2015-05-16 ENCOUNTER — Ambulatory Visit (INDEPENDENT_AMBULATORY_CARE_PROVIDER_SITE_OTHER): Payer: PRIVATE HEALTH INSURANCE | Admitting: Family Medicine

## 2015-05-16 ENCOUNTER — Encounter: Payer: Self-pay | Admitting: Family Medicine

## 2015-05-16 VITALS — BP 105/60 | HR 69 | Temp 97.1°F | Ht 68.0 in | Wt 139.6 lb

## 2015-05-16 DIAGNOSIS — K264 Chronic or unspecified duodenal ulcer with hemorrhage: Secondary | ICD-10-CM | POA: Diagnosis not present

## 2015-05-16 DIAGNOSIS — Z13220 Encounter for screening for lipoid disorders: Secondary | ICD-10-CM

## 2015-05-16 DIAGNOSIS — R1084 Generalized abdominal pain: Secondary | ICD-10-CM | POA: Diagnosis not present

## 2015-05-16 DIAGNOSIS — G8929 Other chronic pain: Secondary | ICD-10-CM

## 2015-05-16 NOTE — Progress Notes (Signed)
BP 105/60 mmHg  Pulse 69  Temp(Src) 97.1 F (36.2 C) (Oral)  Ht _0  (1.727 m)  Wt 139 lb 9.6 oz (63.322 kg)  BMI 21.23 kg/m2   Subjective:    Patient ID: Shane Stone, male    DOB: [birth year], 30 y.o.   MRN: [id number]  HPI: Shane Stone is a 30 y.o. male presenting on 05/16/2015 for Establish Care and Referral   HPI  Abdominal pain chronic Patient is coming in today for establish care as a new patient here and to get a referral to Dr. Corey Skains. He says he has had chronic abdominal pain secondary to his Crohn's and has been taking street narcotic pills to control the pain and has been prescribed some previously. He would like to go see Dr. Amada Jupiter can get on Suboxone and get off of street pills. He says the abdominal pain is generalized and comes in waves but is there most of the time. The pain is rated as 8 out of 10 most of the time. It does go up to 10 out of 10 often noted. He denies any fevers or chills. He does have intermittent blood in his stool. He he also in January had an episode of GI bleeding which was found to be a duodenal ulcer for which he had an EGD and clipping of the areas to stop the bleeding. He was on chronic anti-inflammatories prior to that and is now on omeprazole because of that. His blood counts during that time with down quite a bit and he received multiple blood transfusions and upon discharge came up to 7.9 hemoglobin. She has not had it checked since that time. He does admit to fatigue and is also taking iron pills intermittently and B12 as well.  Relevant past medical, surgical, family and social history reviewed and updated as indicated. Interim medical history since our last visit reviewed. Allergies and medications reviewed and updated.  Review of Systems  Constitutional: Positive for fatigue. Negative for fever and chills.  HENT: Negative for ear discharge and ear pain.   Eyes: Negative for discharge and visual disturbance.  Respiratory:  Negative for shortness of breath and wheezing.   Cardiovascular: Negative for chest pain and leg swelling.  Gastrointestinal: Positive for abdominal pain and blood in stool (intermittently). Negative for nausea, vomiting, diarrhea and constipation.  Genitourinary: Negative for difficulty urinating.  Musculoskeletal: Negative for back pain and gait problem.  Skin: Negative for rash.  Neurological: Negative for dizziness, syncope, light-headedness and headaches.  All other systems reviewed and are negative.   Per HPI unless specifically indicated above     Medication List       This list is accurate as of: 05/16/15 10:12 AM.  Always use your most recent med list.               ferrous sulfate 325 (65 FE) MG tablet  Take 1 tablet (325 mg total) by mouth daily with breakfast.     omeprazole 40 MG capsule  Commonly known as:  PRILOSEC  Take 1 capsule (40 mg total) by mouth 2 (two) times daily. Or any approved PPI           Objective:    BP 105/60 mmHg  Pulse 69  Temp(Src) 97.1 F (36.2 C) (Oral)  Ht _1  (1.727 m)  Wt 139 lb 9.6 oz (63.322 kg)  BMI 21.23 kg/m2  Wt Readings from Last 3 Encounters:  05/16/15 139 lb 9.6 oz (63.322  kg)  03/18/14 142 lb 10.2 oz (64.7 kg)  10/30/13 136 lb 1.6 oz (61.735 kg)    Physical Exam  Constitutional: He is oriented to person, place, and time. He appears well-developed and well-nourished. No distress.  HENT:  Right Ear: External ear normal.  Left Ear: External ear normal.  Nose: Nose normal.  Mouth/Throat: Oropharynx is clear and moist. No oropharyngeal exudate.  Eyes: Conjunctivae and EOM are normal. Pupils are equal, round, and reactive to light. Right eye exhibits no discharge. No scleral icterus.  Neck: Neck supple. No thyromegaly present.  Cardiovascular: Normal rate, regular rhythm, normal heart sounds and intact distal pulses.   No murmur heard. Pulmonary/Chest: Effort normal and breath sounds normal. No respiratory  distress. He has no wheezes.  Abdominal: He exhibits no distension. There is tenderness (Diffuse generalized, no rebound). There is no rebound and no guarding.  Musculoskeletal: Normal range of motion. He exhibits no edema.  Lymphadenopathy:    He has no cervical adenopathy.  Neurological: He is alert and oriented to person, place, and time. Coordination normal.  Skin: Skin is warm and dry. No rash noted. He is not diaphoretic.  Psychiatric: He has a normal mood and affect. His behavior is normal.  Vitals reviewed.     Assessment & Plan:   Problem List Items Addressed This Visit      Digestive   GIB (gastrointestinal bleeding)   Relevant Orders   CBC with Differential/Platelet   BMP8+EGFR    Other Visit Diagnoses    Chronic generalized abdominal pain    -  Primary    Patient has a history of Crohn's and chronic abdominal pain, will refer to GI once he finds the one that he wants to go to but he also would like to see Dr. Cammy Brochure    Relevant Orders    Ambulatory referral to Pain Clinic    Screening, lipid        Relevant Orders    Lipid panel       Follow up plan: Return in about 1 year (around 05/15/2016), or if symptoms worsen or fail to improve.  Counseling provided for all of the vaccine components Orders Placed This Encounter  Procedures  . CBC with Differential/Platelet  . BMP8+EGFR  . Lipid panel  . Ambulatory referral to [place], MD [facility] 05/16/2015, 10:12 AM

## 2015-09-01 ENCOUNTER — Encounter (HOSPITAL_COMMUNITY): Payer: Self-pay | Admitting: Emergency Medicine

## 2015-09-01 ENCOUNTER — Emergency Department (HOSPITAL_COMMUNITY)
Admission: EM | Admit: 2015-09-01 | Discharge: 2015-09-02 | Disposition: A | Discharge status: Home / Self Care | Payer: PRIVATE HEALTH INSURANCE | Attending: Emergency Medicine | Admitting: Emergency Medicine

## 2015-09-01 DIAGNOSIS — R000 Tachycardia, unspecified: Secondary | ICD-10-CM | POA: Insufficient documentation

## 2015-09-01 DIAGNOSIS — F17210 Nicotine dependence, cigarettes, uncomplicated: Secondary | ICD-10-CM | POA: Insufficient documentation

## 2015-09-01 DIAGNOSIS — L03311 Cellulitis of abdominal wall: Secondary | ICD-10-CM | POA: Insufficient documentation

## 2015-09-01 DIAGNOSIS — Z79899 Other long term (current) drug therapy: Secondary | ICD-10-CM | POA: Diagnosis not present

## 2015-09-01 LAB — CBC WITH DIFFERENTIAL/PLATELET
Basophils Absolute: 0 10*3/uL (ref 0.0–0.1)
Basophils Relative: 0 %
Eosinophils Absolute: 0 10*3/uL (ref 0.0–0.7)
Eosinophils Relative: 0 %
HCT: 34.9 % — ABNORMAL LOW (ref 39.0–52.0)
Hemoglobin: 11.4 g/dL — ABNORMAL LOW (ref 13.0–17.0)
Lymphocytes Relative: 24 %
Lymphs Abs: 2.2 10*3/uL (ref 0.7–4.0)
MCH: 27.9 pg (ref 26.0–34.0)
MCHC: 32.7 g/dL (ref 30.0–36.0)
MCV: 85.3 fL (ref 78.0–100.0)
Monocytes Absolute: 1 10*3/uL (ref 0.1–1.0)
Monocytes Relative: 12 %
Neutro Abs: 5.7 10*3/uL (ref 1.7–7.7)
Neutrophils Relative %: 64 %
Platelets: 192 10*3/uL (ref 150–400)
RBC: 4.09 MIL/uL — ABNORMAL LOW (ref 4.22–5.81)
RDW: 13.9 % (ref 11.5–15.5)
WBC: 9 10*3/uL (ref 4.0–10.5)

## 2015-09-01 LAB — BASIC METABOLIC PANEL
Anion gap: 7 (ref 5–15)
BUN: 13 mg/dL (ref 6–20)
CO2: 26 mmol/L (ref 22–32)
Calcium: 8.3 mg/dL — ABNORMAL LOW (ref 8.9–10.3)
Chloride: 99 mmol/L — ABNORMAL LOW (ref 101–111)
Creatinine, Ser: 0.84 mg/dL (ref 0.61–1.24)
GFR calc Af Amer: >60 mL/min (ref >60)
GFR calc non Af Amer: >60 mL/min (ref >60)
Glucose, Bld: 103 mg/dL — ABNORMAL HIGH (ref 65–99)
Potassium: 3.5 mmol/L (ref 3.5–5.1)
Sodium: 132 mmol/L — ABNORMAL LOW (ref 135–145)

## 2015-09-01 MED ORDER — VANCOMYCIN HCL IN DEXTROSE 1-5 GM/200ML-% IV SOLN
1000.0000 mg | Freq: Once | INTRAVENOUS | Status: AC
  Administered 2015-09-01: 1000 mg via INTRAVENOUS
  Filled 2015-09-01 (×2): qty 200

## 2015-09-01 MED ORDER — ONDANSETRON HCL 4 MG/2ML IJ SOLN
4.0000 mg | Freq: Once | INTRAMUSCULAR | Status: AC
  Administered 2015-09-01: 4 mg via INTRAVENOUS
  Filled 2015-09-01 (×2): qty 2

## 2015-09-01 MED ORDER — ONDANSETRON HCL 4 MG PO TABS: 4.0000 mg | ORAL_TABLET | Freq: Once | ORAL | Status: DC

## 2015-09-01 MED ORDER — ACETAMINOPHEN 500 MG PO TABS
1000.0000 mg | ORAL_TABLET | Freq: Once | ORAL | Status: AC
Start: 2015-09-01 — End: 2015-09-01
  Administered 2015-09-01: 1000 mg via ORAL
  Filled 2015-09-01: qty 2

## 2015-09-01 MED ORDER — HYDROCODONE-ACETAMINOPHEN 5-325 MG PO TABS: 2.0000 | ORAL_TABLET | Freq: Once | ORAL | Status: DC

## 2015-09-01 MED ORDER — MORPHINE SULFATE (PF) 4 MG/ML IV SOLN
4.0000 mg | Freq: Once | INTRAVENOUS | Status: AC
  Administered 2015-09-01: 4 mg via INTRAVENOUS
  Filled 2015-09-01 (×2): qty 1

## 2015-09-01 MED ORDER — CEFTRIAXONE SODIUM 1 G IJ SOLR: 1.0000 g | Freq: Once | INTRAMUSCULAR | Status: DC

## 2015-09-01 MED ORDER — DOXYCYCLINE HYCLATE 100 MG PO TABS: 100.0000 mg | ORAL_TABLET | Freq: Once | ORAL | Status: DC

## 2015-09-01 NOTE — ED Notes (Signed)
Pt with abscess to R side.

## 2015-09-01 NOTE — ED Provider Notes (Signed)
CSN: [id number]     Arrival date & time 09/01/15  2039 History   First MD Initiated Contact with Patient 09/01/15 2151     Chief Complaint  Patient presents with  . Abscess     (Consider location/radiation/quality/duration/timing/severity/associated sxs/prior Treatment) HPI Comments: Patient is a 30 year old male who presents to the emergency department with an abscess of the right abdominal wall.  The patient states that this has been going on for the last 2 or 3 days, and getting progressively worse. He states that he has pain with movement. He has pain with walking. He has had temperature elevation of 101.2 maximum. He has not had nausea or vomiting reported. He states he's had problems with abscess areas in the past on. He is tried Tylenol but this does not seem to be helping. He presents now for assistance with this issue.  Patient is a 30 y.o. male presenting with abscess. The history is provided by the patient.  Abscess Abscess location: Abdominal wall. Associated symptoms: fever     Past Medical History  Diagnosis Date  . Crohn's    Past Surgical History  Procedure Laterality Date  . Mouth surgery  2010  . Small intestine surgery      from infection. no intestines removed.   . Esophagogastroduodenoscopy N/A 03/16/2014    Procedure: ESOPHAGOGASTRODUODENOSCOPY (EGD);  Surgeon: Hilarie Fredrickson, MD;  [facility] END[facility]COPY;  Service: Endoscopy;  Laterality: N/A;  bedside   Family History  Problem Relation Age of Onset  . Colon cancer Neg Hx   . Crohn's disease Neg Hx   . Inflammatory bowel disease Neg Hx    Social History  Substance Use Topics  . Smoking status: Current Every Day Smoker -- 1.00 packs/day for 9 years    Types: Cigarettes  . Smokeless tobacco: Never Used  . Alcohol Use: No    Review of Systems  Constitutional: Positive for fever.  Skin:       Abscess  All other systems reviewed and are negative.     Allergies  Review of patient's allergies  indicates no known allergies.  Home Medications   Prior to Admission medications   Medication Sig Start Date End Date Taking? Authorizing Provider  ferrous sulfate 325 (65 FE) MG tablet Take 1 tablet (325 mg total) by mouth daily with breakfast. Patient not taking: Reported on 05/16/2015 03/22/14   Maretta Bees, MD  omeprazole (PRILOSEC) 40 MG capsule Take 1 capsule (40 mg total) by mouth 2 (two) times daily. Or any approved PPI 03/22/14   Leroy Sea, MD   BP 108/69 mmHg  Pulse 108  Temp(Src) 100.3 F (37.9 C) (Oral)  Resp 24  Ht 5' 7.5" (1.715 m)  Wt 61.236 kg  BMI 20.82 kg/m2  SpO2 98% Physical Exam  Constitutional: He is oriented to person, place, and time. He appears well-developed and well-nourished.  Non-toxic appearance.  HENT:  Head: Normocephalic.  Right Ear: Tympanic membrane and external ear normal.  Left Ear: Tympanic membrane and external ear normal.  Eyes: EOM and lids are normal. Pupils are equal, round, and reactive to light.  Neck: Normal range of motion. Neck supple. Carotid bruit is not present.  Cardiovascular: Regular rhythm, normal heart sounds, intact distal pulses and normal pulses.  Tachycardia present.   Pulmonary/Chest: Breath sounds normal. No respiratory distress.  Abdominal: Soft. Bowel sounds are normal. There is no tenderness. There is no guarding.  There is increased redness increased warmth and increased pain to the  right lower abdominal wall. There is scar tissue from a previous abscess that seems to be swollen on. There is increased redness around this scar tissue area. The area is tender to palpation.  Bowel sounds are present and active. No mass appreciated.  Musculoskeletal: Normal range of motion.  Lymphadenopathy:       Head (right side): No submandibular adenopathy present.       Head (left side): No submandibular adenopathy present.    He has no cervical adenopathy.  Neurological: He is alert and oriented to person, place, and  time. He has normal strength. No cranial nerve deficit or sensory deficit.  Skin: Skin is warm and dry.  Psychiatric: He has a normal mood and affect. His speech is normal.  Nursing note and vitals reviewed.   ED Course  Procedures (including critical care time) Labs Review Labs Reviewed  CBC WITH DIFFERENTIAL/PLATELET    Imaging Review No results found. I have personally reviewed and evaluated these images and lab results as part of my medical decision-making.   EKG Interpretation None      MDM  Vital signs reveal the pulse rate being elevated at 108, the temperature is elevated at 100.3. Pulse oximetry is within normal limits at 98-99% on room air.  Patient transferred to the major care area of the emergency department.  Patient started on vancomycin on.  Complete blood count is nonacute. The basic metabolic panel shows the sodium to be slightly low at 132, and the chloride low at 99. Otherwise the basic metabolic panel is within normal limits.  The patient tolerated the vancomycin without problem. Patient started on doxycycline, and Norco for pain. The patient is to be rechecked in 2 days. He will return solar if any changes, problems, or concerns.    Final diagnoses:  None    **I have reviewed nursing notes, vital signs, and all appropriate lab and imaging results for this patient.Ivery Quale, PA-C 09/02/15 1751  Zadie Rhine, MD 09/07/15 [phone]

## 2015-09-01 NOTE — ED Notes (Signed)
Attempted x 2 for IV access without success, asked CN to assess for access. Will move pt to main part of ED due to acuity level

## 2015-09-02 MED ORDER — DOXYCYCLINE HYCLATE 100 MG PO CAPS: 100.0000 mg | ORAL_CAPSULE | Freq: Two times a day (BID) | ORAL | Status: DC

## 2015-09-02 MED ORDER — HYDROCODONE-ACETAMINOPHEN 5-325 MG PO TABS: 1.0000 | ORAL_TABLET | ORAL | Status: DC | PRN

## 2015-09-02 NOTE — Discharge Instructions (Signed)
Please soak the abd area in warm epsom salt water daily. Use doxycycline two times daily. Use norco for pain if needed. This medication may cause drowsiness. Please do not drink, drive, or participate in activity that requires concentration while taking this medication. PLEASE SEE YOUR PRIMARY MD, OR RETURN TO THE EMERGENCY DEPT ON Thursday July 15 FOR RECHECK. Cellulitis Cellulitis is an infection of the skin and the tissue under the skin. The infected area is usually red and tender. This happens most often in the arms and lower legs. HOME CARE   Take your antibiotic medicine as told. Finish the medicine even if you start to feel better.  Keep the infected arm or leg raised (elevated).  Put a warm cloth on the area up to 4 times per day.  Only take medicines as told by your doctor.  Keep all doctor visits as told. GET HELP IF:  You see red streaks on the skin coming from the infected area.  Your red area gets bigger or turns a dark color.  Your bone or joint under the infected area is painful after the skin heals.  Your infection comes back in the same area or different area.  You have a puffy (swollen) bump in the infected area.  You have new symptoms.  You have a fever. GET HELP RIGHT AWAY IF:   You feel very sleepy.  You throw up (vomit) or have watery poop (diarrhea).  You feel sick and have muscle aches and pains.   This information is not intended to replace advice given to you by your health care provider. Make sure you discuss any questions you have with your health care provider.   Document Released: 07/28/2007 Document Revised: 10/30/2014 Document Reviewed: 04/26/2011 Elsevier Interactive Patient Education [employer]

## 2016-06-15 ENCOUNTER — Encounter (HOSPITAL_COMMUNITY): Payer: Self-pay | Admitting: Emergency Medicine

## 2016-06-15 ENCOUNTER — Emergency Department (HOSPITAL_COMMUNITY)
Admission: EM | Admit: 2016-06-15 | Discharge: 2016-06-15 | Disposition: A | Discharge status: Home / Self Care | Payer: PRIVATE HEALTH INSURANCE | Attending: Emergency Medicine | Admitting: Emergency Medicine

## 2016-06-15 DIAGNOSIS — F17210 Nicotine dependence, cigarettes, uncomplicated: Secondary | ICD-10-CM | POA: Insufficient documentation

## 2016-06-15 DIAGNOSIS — L02211 Cutaneous abscess of abdominal wall: Secondary | ICD-10-CM | POA: Insufficient documentation

## 2016-06-15 DIAGNOSIS — L0291 Cutaneous abscess, unspecified: Secondary | ICD-10-CM

## 2016-06-15 MED ORDER — CEPHALEXIN 500 MG PO CAPS: 500.0000 mg | ORAL_CAPSULE | Freq: Four times a day (QID) | ORAL | 0 refills | Status: DC

## 2016-06-15 MED ORDER — SULFAMETHOXAZOLE-TRIMETHOPRIM 800-160 MG PO TABS: 1.0000 | ORAL_TABLET | Freq: Once | ORAL | 0 refills | Status: DC

## 2016-06-15 MED ORDER — CEPHALEXIN 500 MG PO CAPS
500.0000 mg | ORAL_CAPSULE | Freq: Once | ORAL | Status: AC
  Administered 2016-06-15: 500 mg via ORAL
  Filled 2016-06-15: qty 1

## 2016-06-15 MED ORDER — SULFAMETHOXAZOLE-TRIMETHOPRIM 800-160 MG PO TABS
1.0000 | ORAL_TABLET | Freq: Once | ORAL | Status: AC
  Administered 2016-06-15: 1 via ORAL
  Filled 2016-06-15: qty 1

## 2016-06-15 MED ORDER — SULFAMETHOXAZOLE-TRIMETHOPRIM 800-160 MG PO TABS: 1.0000 | ORAL_TABLET | Freq: Two times a day (BID) | ORAL | 0 refills | Status: AC

## 2016-06-15 MED ORDER — ONDANSETRON HCL 4 MG PO TABS
4.0000 mg | ORAL_TABLET | Freq: Once | ORAL | Status: AC
  Administered 2016-06-15: 4 mg via ORAL
  Filled 2016-06-15: qty 1

## 2016-06-15 MED ORDER — ONDANSETRON HCL 4 MG PO TABS: 4.0000 mg | ORAL_TABLET | Freq: Four times a day (QID) | ORAL | 0 refills | Status: DC

## 2016-06-15 NOTE — Discharge Instructions (Signed)
Please use warm tub soaks daily until the abscess area heals. Please apply a bandage to protect the abscess area. Use Keflex and Bactrim daily with food. May use Zofran for nausea if needed. Please see Dr. Lovell Sheehan for surgical evaluation if this area is not improving.

## 2016-06-15 NOTE — ED Notes (Signed)
ED Provider at bedside. 

## 2016-06-15 NOTE — ED Triage Notes (Signed)
Pt c/o abscess busting while reaching up at work. Pt has abscess to the right flank.

## 2016-06-15 NOTE — ED Provider Notes (Signed)
[facility]-EMERGENCY DEPT Provider Note   CSN: [id number] Arrival date & time: 06/15/16  2129   By signing my name below, I, Bobbie Stack, attest that this documentation has been prepared under the direction and in the presence of Ivery Quale, PA-C. Electronically Signed: Bobbie Stack, Scribe. 06/15/16. 11:02 PM. History   Chief Complaint Chief Complaint  Patient presents with  . Abscess    The history is provided by the patient. No language interpreter was used.  HPI Comments: Shane Stone is a 31 y.o. male with a hx of crohn's disease who presents to the Emergency Department complaining of an abscess which burst earlier tonight. He states that this abscess has been healing for the past 1.5 months. He reached up to get something while he was at work tonight when the abscess burst. He reports some soreness around the area but no severe pain. He states that he had an abscess in the same place in the past multiple times. He has been to see someone for this issue in the past. He denies fevers, nausea, or vomiting.  Past Medical History:  Diagnosis Date  . Crohn's     Patient Active Problem List   Diagnosis Date Noted  . Bacteremia due to Streptococcus   . Hemorrhagic shock (HCC) 03/17/2014  . GIB (gastrointestinal bleeding) 03/16/2014  . Duodenal ulcer hemorrhagic 03/16/2014  . Crohn's disease of ileum with abscess (HCC) 10/30/2013    Past Surgical History:  Procedure Laterality Date  . ESOPHAGOGASTRODUODENOSCOPY N/A 03/16/2014   Procedure: ESOPHAGOGASTRODUODENOSCOPY (EGD);  Surgeon: Hilarie Fredrickson, MD;  Location: [facility] ENDOSCOPY;  Service: Endoscopy;  Laterality: N/A;  bedside  . MOUTH SURGERY  2010  . SMALL INTESTINE SURGERY     from infection. no intestines removed.        Home Medications    Prior to Admission medications   Medication Sig Start Date End Date Taking? Authorizing Provider  acetaminophen (TYLENOL) 500 MG tablet Take 500-1,000 mg by mouth daily as  needed for mild pain or moderate pain.   Yes Historical Provider, MD    Family History Family History  Problem Relation Age of Onset  . Colon cancer Neg Hx   . Crohn's disease Neg Hx   . Inflammatory bowel disease Neg Hx     Social History Social History  Substance Use Topics  . Smoking status: Current Every Day Smoker    Packs/day: 1.00    Years: 9.00    Types: Cigarettes  . Smokeless tobacco: Never Used  . Alcohol use No     Allergies   Nsaids   Review of Systems Review of Systems  Constitutional: Negative for fever.  Gastrointestinal: Negative for abdominal pain, nausea and vomiting.  Skin: Positive for wound (Abscess on the abdomen).  All other systems reviewed and are negative.    Physical Exam Updated Vital Signs BP 115/73 (BP Location: Right Arm)   Pulse 87   Temp 98.2 F (36.8 C) (Oral)   Resp 18   Ht 5\' 7"  (1.702 m)   Wt 132 lb (59.9 kg)   SpO2 100%   BMI 20.67 kg/m   Physical Exam  Constitutional: He is oriented to person, place, and time. He appears well-developed and well-nourished.  HENT:  Head: Normocephalic.  Eyes: EOM are normal.  Neck: Normal range of motion.  Cardiovascular: Normal rate and regular rhythm.  Exam reveals no gallop and no friction rub.   Regular rate and rhythm. No rubs or gallops appreciated.  Pulmonary/Chest: Effort  normal.  Symmetric rise and fall of the chest. Lungs clear.  Abdominal: He exhibits no distension.  There is a 1.1 cm cyst of the lower right flank. There is minimum drainage present. No red streaks appreciated. Area is not hot. Area is TTP. Bowel sounds are present and active.   Musculoskeletal: Normal range of motion.  Neurological: He is alert and oriented to person, place, and time.  Psychiatric: He has a normal mood and affect.  Nursing note and vitals reviewed.    ED Treatments / Results  DIAGNOSTIC STUDIES: Oxygen Saturation is 100% on RA, normal by my interpretation.    COORDINATION OF  CARE: 10:54 PM Discussed treatment plan with pt at bedside and pt agreed to plan. I will start the patient on antibiotics.  Labs (all labs ordered are listed, but only abnormal results are displayed) Labs Reviewed - No data to display  EKG  EKG Interpretation None       Radiology No results found.  Procedures Procedures (including critical care time)  Medications Ordered in ED Medications - No data to display   Initial Impression / Assessment and Plan / ED Course  I have reviewed the triage vital signs and the nursing notes.  Pertinent labs & imaging results that were available during my care of the patient were reviewed by me and considered in my medical decision making (see chart for details).      Final Clinical Impressions(s) / ED Diagnoses MDM Vital signs within normal limits. Patient has a recurring abscess area of the lower right flank. Today while at work it began to drain, the patient became concerned and came to the emergency department.  There is no red streaking appreciated. There is no active bleeding. The area is tender to touch. Bowel sounds are present and active.  The patient will be asked to apply dressing to the area daily until it heals. Will use warm tub soaks daily. Patient will be treated with Keflex and Bactrim. He will see Dr. Lovell Sheehan for surgical evaluation if this is not improving.    Final diagnoses:  Abscess    New Prescriptions New Prescriptions   No medications on file   **I personally performed the services described in this documentation, which was scribed in my presence. The recorded information has been reviewed and is accurate.Ivery Quale, PA-C 06/15/16 2324    Samuel Jester, DO 06/20/16 1121

## 2016-06-25 IMAGING — CT CT IMAGE GUIDED DRAINAGE BY PERCUTANEOUS CATHETER
1 of 4 series · 12 of 32 positions shown, 17 images · non-contrast
Comparison: none

CLINICAL DATA: 28-year-old male with a history of Crohn's disease.
Previous CT demonstrates intra-abdominal abscess formation.
Currently complaining of fevers and night sweats.

[Series 2: i-spiral 5.0 b40f · axial · 0.66mm/px · z∈[+748,+1020]mm · 12 of 90 slices shown, 17 images]
[im 6/90  soft-tissue]
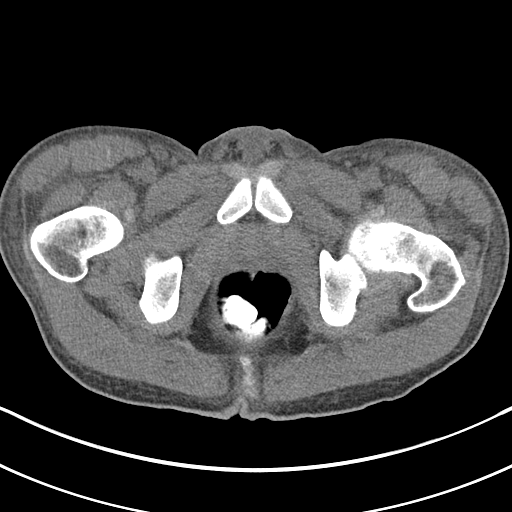
[im 6/90  bone]
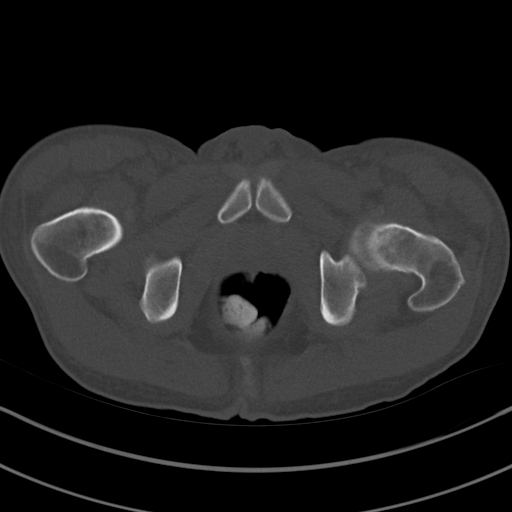
[im 17/90  soft-tissue]
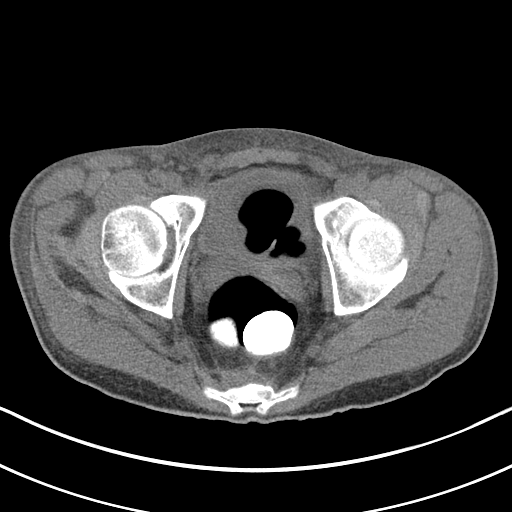
[im 23/90  soft-tissue]
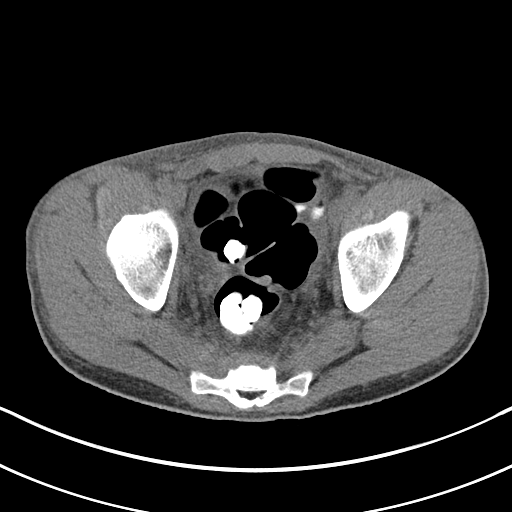
[im 28/90  soft-tissue]
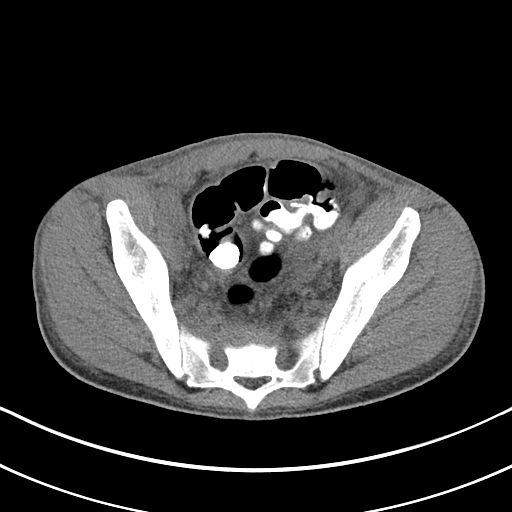
[im 39/90  soft-tissue]
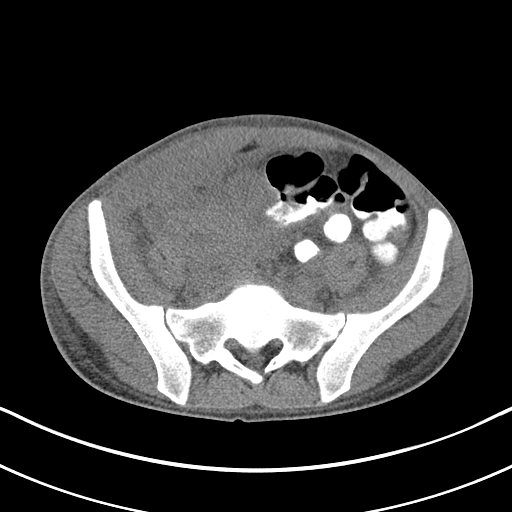
[im 45/90  soft-tissue]
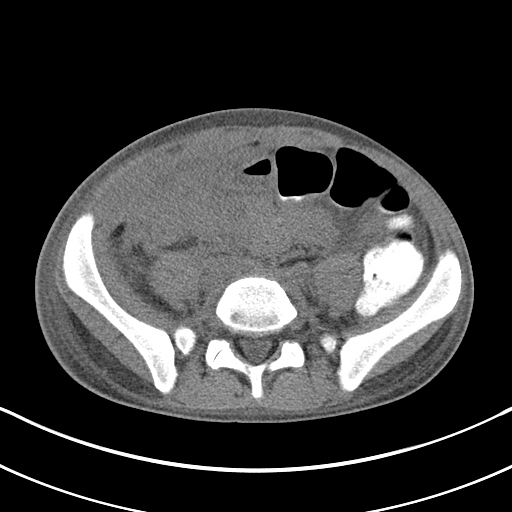
[im 51/90  soft-tissue]
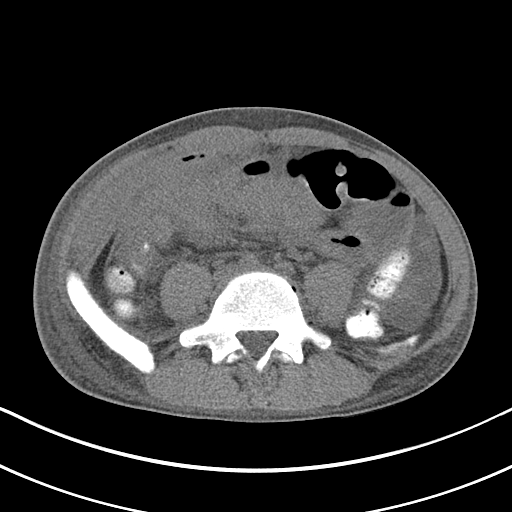
[im 62/90  soft-tissue]
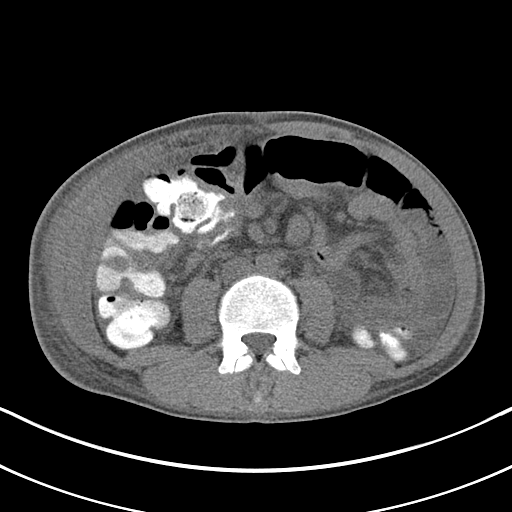
[im 67/90  soft-tissue]
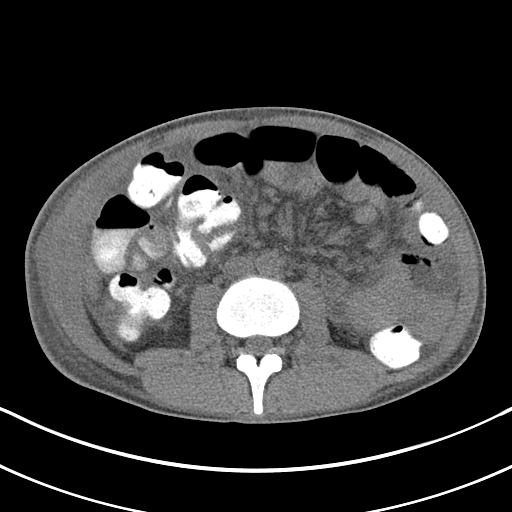
[im 67/90  lung]
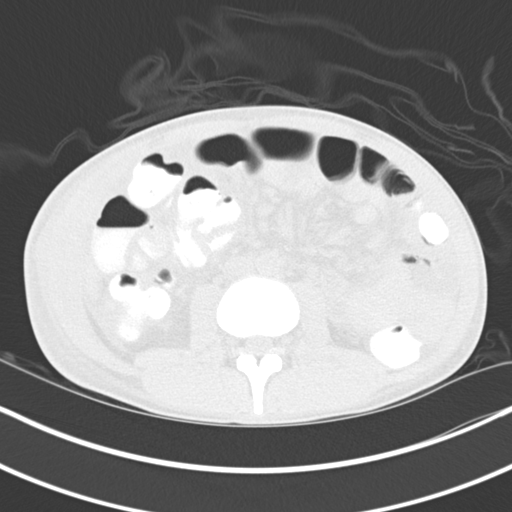
[im 67/90  bone]
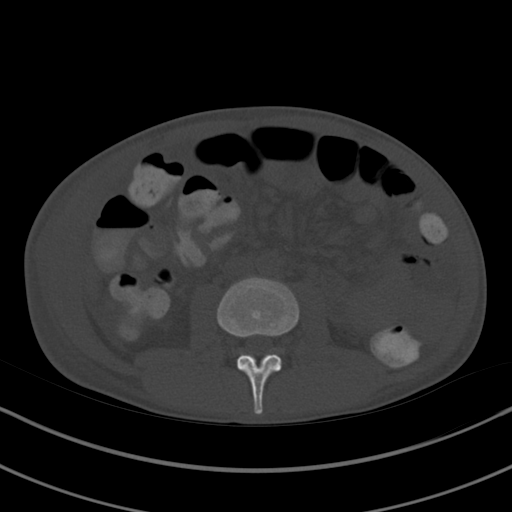
[im 73/90  soft-tissue]
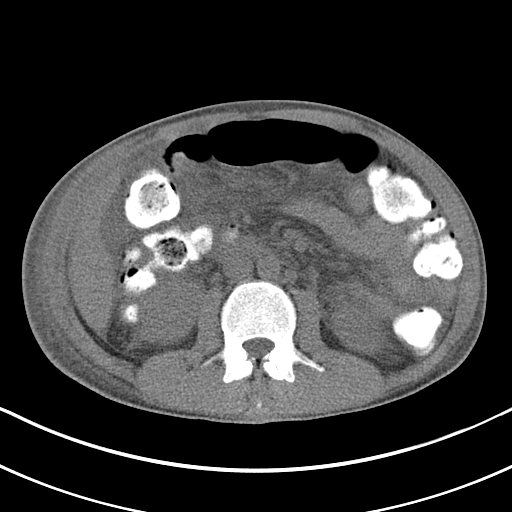
[im 73/90  lung]
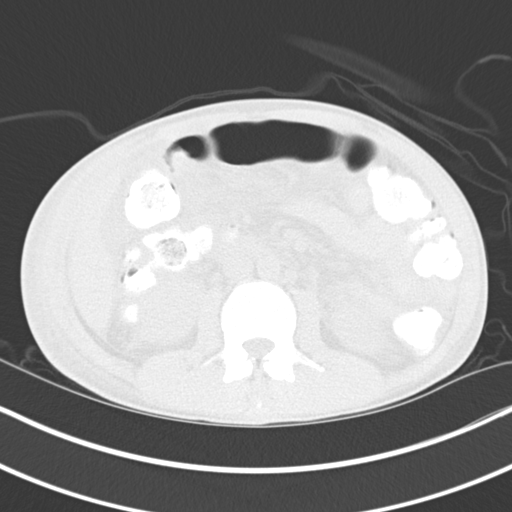
[im 78/90  lung]
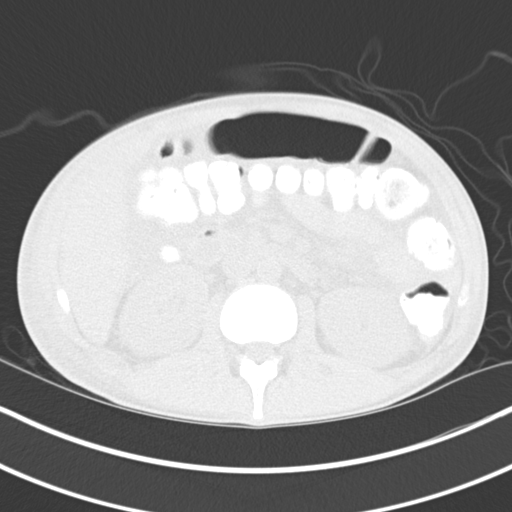
[im 84/90  soft-tissue]
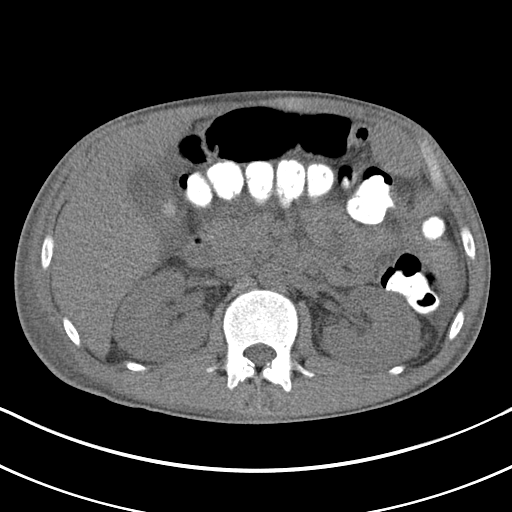
[im 84/90  lung]
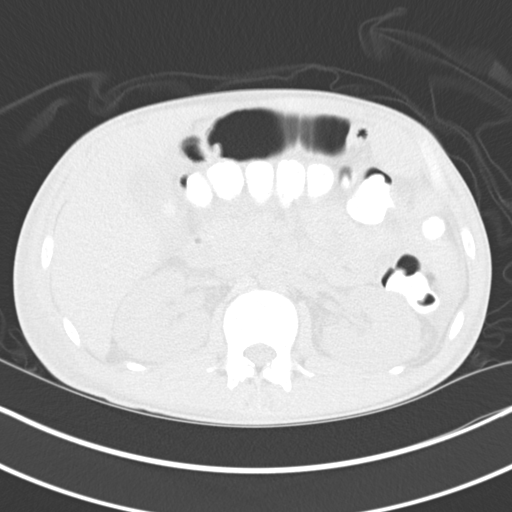

[12 of 32 positions shown; findings below may reference images not displayed]

EXAM:
CT GUIDED DRAINAGE OF abdominal ABSCESS

ANESTHESIA/SEDATION:
2 mg IV Versed 125 mcg IV Fentanyl

Total Moderate Sedation Time:  26 minutes

PROCEDURE:
The procedure, risks, benefits, and alternatives were explained to
the patient. Questions regarding the procedure were encouraged and
answered. The patient understands and consents to the procedure.

Patient brought to the CT suite was placed in supine position on CT
gantry table. A scout CT of the abdomen pelvis was performed for
surgical planning purposes. Once we determine a safe approach
towards the right-sided abdominal abscess at the level of the iliac
crest, a fiducial marker was placed on the skin for targeting
purposes and a localized CT image was acquired.

The operative field was prepped with Betadine/chlorhexidinein a
sterile fashion, and a sterile drape was applied covering the
operative field. A sterile gown and sterile gloves were used for the
procedure. Local anesthesia was provided with 1% Lidocaine.

The skin and subcutaneous tissues in a region of interest for
generously infiltrated with 2% lidocaine for local anesthesia. A
small stab incision was made with an 11 blade scalpel, and a 10 cm
trocar needle was advanced under CT guidance into a right-sided
abdominal fluid collection. Once we confirmed position with CT
guidance, the stylette was removed from the trocar needle and a
small amount of purulent fluid was aspirated to confirm position.
This sample sent for culture.

An 0.035 short Amplatz wire was advanced to the trocar needle into
the peritoneal fluid collection. Again, this was confirmed with the
CT image. The needle was removed from the wire and serial dilation
with 10 French and 12 French dilators over the wire was performed.
Finally a 12 French pigtail catheter was advanced over the wire into
position. The wire and inner stiffener were removed, and again
purulent fluid was aspirated to drain to confirmed position.

A final image was stored and a total of 15 cc of purulent fluid was
aspirated. The drain was sutured in position and attached to gravity
drainage.

The patient tolerated procedure well and remained hemodynamically
stable throughout.

COMPLICATIONS:
None
FINDINGS: Initial CT images the abdomen demonstrates residual fluid collection
within the right side of the abdomen, consistent with comparison CT
dated 10/30/2013. There does appear to be slightly decreased amount
of fluid. The drain was placed given that the patient is complaining
of night sweats and ongoing fevers, with a concern for potential
clinical worsening if the abscess were not drained.

Images during the case demonstrates a placement trocar needle into
right-sided fluid collection and then modified Seldinger technique
for placement of a 12 French pigtail catheter drain.

15 cc of purulent fluid was aspirated and sent for culture.

Final image demonstrates pigtail catheter within the right
peritoneal cavity.
IMPRESSION: Status post placement of 12 French pigtail catheter into right-sided
peritoneal abscess, with fluid sent to the lab for complete
analysis.

## 2016-08-11 DIAGNOSIS — G8929 Other chronic pain: Secondary | ICD-10-CM | POA: Diagnosis not present

## 2016-08-11 DIAGNOSIS — F1129 Opioid dependence with unspecified opioid-induced disorder: Secondary | ICD-10-CM | POA: Diagnosis not present

## 2016-08-11 DIAGNOSIS — Z79891 Long term (current) use of opiate analgesic: Secondary | ICD-10-CM | POA: Diagnosis not present

## 2016-08-11 DIAGNOSIS — K5090 Crohn's disease, unspecified, without complications: Secondary | ICD-10-CM | POA: Diagnosis not present

## 2016-09-08 DIAGNOSIS — M5416 Radiculopathy, lumbar region: Secondary | ICD-10-CM | POA: Diagnosis not present

## 2016-09-08 DIAGNOSIS — G8929 Other chronic pain: Secondary | ICD-10-CM | POA: Diagnosis not present

## 2016-11-03 DIAGNOSIS — Z79891 Long term (current) use of opiate analgesic: Secondary | ICD-10-CM | POA: Diagnosis not present

## 2016-11-03 DIAGNOSIS — F419 Anxiety disorder, unspecified: Secondary | ICD-10-CM | POA: Diagnosis not present

## 2016-11-03 DIAGNOSIS — F1129 Opioid dependence with unspecified opioid-induced disorder: Secondary | ICD-10-CM | POA: Diagnosis not present

## 2016-11-03 DIAGNOSIS — G8929 Other chronic pain: Secondary | ICD-10-CM | POA: Diagnosis not present

## 2016-12-01 DIAGNOSIS — F1129 Opioid dependence with unspecified opioid-induced disorder: Secondary | ICD-10-CM | POA: Diagnosis not present

## 2016-12-01 DIAGNOSIS — Z79891 Long term (current) use of opiate analgesic: Secondary | ICD-10-CM | POA: Diagnosis not present

## 2016-12-01 DIAGNOSIS — G8929 Other chronic pain: Secondary | ICD-10-CM | POA: Diagnosis not present

## 2016-12-10 ENCOUNTER — Encounter: Payer: Self-pay | Admitting: Family Medicine

## 2016-12-10 ENCOUNTER — Ambulatory Visit (INDEPENDENT_AMBULATORY_CARE_PROVIDER_SITE_OTHER): Payer: BLUE CROSS/BLUE SHIELD | Admitting: Family Medicine

## 2016-12-10 VITALS — BP 103/68 | HR 105 | Temp 98.1°F | Ht 67.0 in | Wt 134.0 lb

## 2016-12-10 DIAGNOSIS — R42 Dizziness and giddiness: Secondary | ICD-10-CM

## 2016-12-10 DIAGNOSIS — A084 Viral intestinal infection, unspecified: Secondary | ICD-10-CM

## 2016-12-10 DIAGNOSIS — R000 Tachycardia, unspecified: Secondary | ICD-10-CM | POA: Diagnosis not present

## 2016-12-10 DIAGNOSIS — M5441 Lumbago with sciatica, right side: Secondary | ICD-10-CM | POA: Diagnosis not present

## 2016-12-10 MED ORDER — ONDANSETRON 4 MG PO TBDP: 4.0000 mg | ORAL_TABLET | Freq: Three times a day (TID) | ORAL | 0 refills | Status: DC | PRN

## 2016-12-10 NOTE — Patient Instructions (Addendum)
As we discussed, you likely have a viral gastroenteritis.  I have sent over Zofran for you to use dissolved in the mouth every 8 hours as needed for nausea or vomiting.  If you develop blood in your vomit, blood in your stool, severe abdominal pain, worsening dizziness or inability to stay hydrated, please seek immediate medical attention.  Additionally, I have ordered blood labs to look for anemia, electrolyte disturbance and thyroid disorder given your elevated heart rate and feelings of dizziness.  They should be back this afternoon and I will contact you with them.  For your intermittent back pain, I have provided you a list of home physical therapy exercises you can perform.  I have also placed a referral to physical therapy.  You will be called for an appointment.  Please schedule an appointment with Dr. Pat Kocher in the next 2-4 weeks for follow-up on your symptoms.  He will be able to place your GI referral and update your health needs at that time.  Viral Gastroenteritis, Adult Viral gastroenteritis is also known as the stomach flu. This condition is caused by various viruses. These viruses can be passed from person to person very easily (are very contagious). This condition may affect your stomach, small intestine, and large intestine. It can cause sudden watery diarrhea, fever, and vomiting. Diarrhea and vomiting can make you feel weak and cause you to become dehydrated. You may not be able to keep fluids down. Dehydration can make you tired and thirsty, cause you to have a dry mouth, and decrease how often you urinate. Older adults and people with other diseases or a weak immune system are at higher risk for dehydration. It is important to replace the fluids that you lose from diarrhea and vomiting. If you become severely dehydrated, you may need to get fluids through an IV tube. What are the causes? Gastroenteritis is caused by various viruses, including rotavirus and norovirus. Norovirus  is the most common cause in adults. You can get sick by eating food, drinking water, or touching a surface contaminated with one of these viruses. You can also get sick from sharing utensils or other personal items with an infected person. What increases the risk? This condition is more likely to develop in people:  Who have a weak defense system (immune system).  Who live with one or more children who are younger than 53 years old.  Who live in a nursing home.  Who go on cruise ships.  What are the signs or symptoms? Symptoms of this condition start suddenly 1-2 days after exposure to a virus. Symptoms may last a few days or as long as a week. The most common symptoms are watery diarrhea and vomiting. Other symptoms include:  Fever.  Headache.  Fatigue.  Pain in the abdomen.  Chills.  Weakness.  Nausea.  Muscle aches.  Loss of appetite.  How is this diagnosed? This condition is diagnosed with a medical history and physical exam. You may also have a stool test to check for viruses or other infections. How is this treated? This condition typically goes away on its own. The focus of treatment is to restore lost fluids (rehydration). Your health care provider may recommend that you take an oral rehydration solution (ORS) to replace important salts and minerals (electrolytes) in your body. Severe cases of this condition may require giving fluids through an IV tube. Treatment may also include medicine to help with your symptoms. Follow these instructions at home: Follow instructions from your  health care provider about how to care for yourself at home. Eating and drinking Follow these recommendations as told by your health care provider:  Take an ORS. This is a drink that is sold at pharmacies and retail stores.  Drink clear fluids in small amounts as you are able. Clear fluids include water, ice chips, diluted fruit juice, and low-calorie sports drinks.  Eat bland,  easy-to-digest foods in small amounts as you are able. These foods include bananas, applesauce, rice, lean meats, toast, and crackers.  Avoid fluids that contain a lot of sugar or caffeine, such as energy drinks, sports drinks, and soda.  Avoid alcohol.  Avoid spicy or fatty foods.  General instructions   Drink enough fluid to keep your urine clear or pale yellow.  Wash your hands often. If soap and water are not available, use hand sanitizer.  Make sure that all people in your household wash their hands well and often.  Take over-the-counter and prescription medicines only as told by your health care provider.  Rest at home while you recover.  Watch your condition for any changes.  Take a warm bath to relieve any burning or pain from frequent diarrhea episodes.  Keep all follow-up visits as told by your health care provider. This is important. Contact a health care provider if:  You cannot keep fluids down.  Your symptoms get worse.  You have new symptoms.  You feel light-headed or dizzy.  You have muscle cramps. Get help right away if:  You have chest pain.  You feel extremely weak or you faint.  You see blood in your vomit.  Your vomit looks like coffee grounds.  You have bloody or black stools or stools that look like tar.  You have a severe headache, a stiff neck, or both.  You have a rash.  You have severe pain, cramping, or bloating in your abdomen.  You have trouble breathing or you are breathing very quickly.  Your heart is beating very quickly.  Your skin feels cold and clammy.  You feel confused.  You have pain when you urinate.  You have signs of dehydration, such as: ? Dark urine, very little urine, or no urine. ? Cracked lips. ? Dry mouth. ? Sunken eyes. ? Sleepiness. ? Weakness. This information is not intended to replace advice given to you by your health care provider. Make sure you discuss any questions you have with your  health care provider. Document Released: 02/08/2005 Document Revised: 07/23/2015 Document Reviewed: 10/15/2014 Elsevier Interactive Patient Education  2017 [employer]

## 2016-12-10 NOTE — Progress Notes (Signed)
Subjective: CC:"sick" PCP: [name], Shane Kaufmann, MD Shane Stone is a 31 y.o. male presenting to clinic today for:  Patient reports that he started feeling poorly on Wednesday.  He reports low-grade fevers at home, nausea without vomiting and nonbloody diarrhea.  He denies sick contacts, myalgias, headaches.  He reports mild chest congestion and rhinorrhea.  No cough.  He has been hydrating well with Gatorade.  He took Tylenol this morning which helped symptoms.  He notes he actually feels slightly better today than previous.  He does report having felt dizzy on Wednesday and Thursday.  He notes he had to leave work for this.  He reports a past medical history significant for GI bleed and anemia, which required a transfusion about 2 years ago.  He is not currently on any iron supplementation.  He is wanting labs to check this today.  Additionally, patient reports onset of right sided gluteal pain radiating down to the posterior thigh and calf.  He denies low back pain per se.  Denies preceding injury.  No numbness, tingling, falls or weakness.  No saddle anesthesia, urinary retention or fecal incontinence.  He has used Tylenol and heating pad for this with some improvement.  He is amenable to going to physical therapy for this, as he is unable to take oral NSAIDs in the setting of Crohn's disease and history of GI bleed.  Allergies  Allergen Reactions  . Nsaids     Cannot take due to Crohns    Past Medical History:  Diagnosis Date  . Crohn's    Family History  Problem Relation Age of Onset  . Colon cancer Neg Hx   . Crohn's disease Neg Hx   . Inflammatory bowel disease Neg Hx     Current Outpatient Prescriptions:  .  acetaminophen (TYLENOL) 500 MG tablet, Take 500-1,000 mg by mouth daily as needed for mild pain or moderate pain., Disp: , Rfl:  .  ondansetron (ZOFRAN ODT) 4 MG disintegrating tablet, Take 1 tablet (4 mg total) by mouth every 8 (eight) hours as needed for nausea or  vomiting., Disp: 20 tablet, Rfl: 0  Social Hx: daily smoker.  Health Maintenance: Flu shot  ROS: Per HPI  Objective: Office vital signs reviewed. BP 103/68   Pulse (!) 105   Temp 98.1 F (36.7 C) (Oral)   Ht '5\' 7"'  (1.702 m)   Wt 134 lb (60.8 kg)   BMI 20.99 kg/m   Physical Examination:  General: Awake, alert, thin male, nontoxic appearing, pale, No acute distress HEENT: Normal    Neck: No masses palpated. No lymphadenopathy    Ears: Tympanic membranes intact, normal light reflex, no erythema, no bulging    Eyes: PERRLA, extraocular membranes intact, sclera white    Nose: nasal turbinates moist, clear nasal discharge    Throat: moist mucus membranes, no erythema, no tonsillar exudate.  Airway is patent Cardio: tachycardic and regular rhythm, S1S2 heard, no murmurs appreciated Pulm: clear to auscultation bilaterally, no wheezes, rhonchi or rales; normal work of breathing on room air GI: soft, non-tender, non-distended, bowel sounds present x4, no hepatomegaly, no splenomegaly, no masses MSK: normal gait and station.  He has full active range of motion of the bilateral lower extremities.  No weakness Neuro: follows commands, light touch sensation grossly in tact  Assessment/ Plan: 31 y.o. male   1. Viral gastroenteritis Patient does have a history of Crohn's disease not on medication.  He is not currently established with a gastroenterologist.  No current GI bleed.  Tolerating fluids.  No evidence of dehydration on exam though he does have a slight increased heart rate.  Zofran ODT 4 mg every 8 hours as needed nausea vomiting was prescribed.  I recommended that he push fluids.  A work note was provided to him today.  Strict return precautions and reasons for emergent evaluation emergency he voiced good understanding. - ondansetron (ZOFRAN ODT) 4 MG disintegrating tablet; Take 1 tablet (4 mg total) by mouth every 8 (eight) hours as needed for nausea or vomiting.  Dispense: 20  tablet; Refill: 0  2. Dizziness Intermittent and not present on today's exam.  Given his history of GI bleed in the past and current tachycardia, a CBC with differential to evaluate hemoglobin level and basic metabolic panel was ordered to evaluate electrolyte status.  Hydration encouraged. - CBC with Differential - BMP8+EGFR  3. Tachycardia Mild, may be secondary to intermittent fever and illness.  No physical evidence of dehydration other than increased heart rate.  He also has a history of GI bleed in the past and therefore will obtain labs to evaluate this as a possible etiology.  Additionally, thyroid-stimulating hormone was checked to rule out possible thyroid etiology. - CBC with Differential - BMP8+EGFR - TSH  4. Acute right-sided low back pain with right-sided sciatica With no focal deficits on today's exam.  Okay to continue Tylenol and topical therapies.  I will refer to physical therapy for further assistance, as patient is unable to take oral NSAIDs as above. - Ambulatory referral to Physical Therapy   Orders Placed This Encounter  Procedures  . CBC with Differential  . BMP8+EGFR  . TSH  . Ambulatory referral to Physical Therapy    Referral Priority:   Routine    Referral Type:   Physical Medicine    Referral Reason:   Specialty Services Required    Requested Specialty:   Physical Therapy    Number of Visits Requested:   1   Meds ordered this encounter  Medications  . ondansetron (ZOFRAN ODT) 4 MG disintegrating tablet    Sig: Take 1 tablet (4 mg total) by mouth every 8 (eight) hours as needed for nausea or vomiting.    Dispense:  20 tablet    Refill:  0   I encourage patient to follow-up in the next 2 weeks with his primary care doctor for follow-up on chronic illness and other health maintenance needs.  Shane Norlander, DO [place] [phone]

## 2016-12-11 LAB — CBC WITH DIFFERENTIAL/PLATELET
Basophils Absolute: 0 10*3/uL (ref 0.0–0.2)
Basos: 0 %
EOS (ABSOLUTE): 0.1 10*3/uL (ref 0.0–0.4)
Eos: 1 %
Hematocrit: 33.2 % — ABNORMAL LOW (ref 37.5–51.0)
Hemoglobin: 10.4 g/dL — ABNORMAL LOW (ref 13.0–17.7)
Immature Grans (Abs): 0 10*3/uL (ref 0.0–0.1)
Immature Granulocytes: 0 %
Lymphocytes Absolute: 1.9 10*3/uL (ref 0.7–3.1)
Lymphs: 22 %
MCH: 25.2 pg — ABNORMAL LOW (ref 26.6–33.0)
MCHC: 31.3 g/dL — ABNORMAL LOW (ref 31.5–35.7)
MCV: 81 fL (ref 79–97)
Monocytes Absolute: 0.7 10*3/uL (ref 0.1–0.9)
Monocytes: 8 %
Neutrophils Absolute: 6 10*3/uL (ref 1.4–7.0)
Neutrophils: 69 %
Platelets: 398 10*3/uL — ABNORMAL HIGH (ref 150–379)
RBC: 4.12 x10E6/uL — ABNORMAL LOW (ref 4.14–5.80)
RDW: 17.1 % — ABNORMAL HIGH (ref 12.3–15.4)
WBC: 8.8 10*3/uL (ref 3.4–10.8)

## 2016-12-11 LAB — TSH: TSH: 1.76 u[IU]/mL (ref 0.450–4.500)

## 2016-12-11 LAB — BMP8+EGFR
BUN/Creatinine Ratio: 14 (ref 9–20)
BUN: 11 mg/dL (ref 6–20)
CO2: 24 mmol/L (ref 20–29)
Calcium: 9.1 mg/dL (ref 8.7–10.2)
Chloride: 102 mmol/L (ref 96–106)
Creatinine, Ser: 0.77 mg/dL (ref 0.76–1.27)
GFR calc Af Amer: 138 mL/min/{1.73_m2} (ref >59)
GFR calc non Af Amer: 119 mL/min/{1.73_m2} (ref >59)
Glucose: 99 mg/dL (ref 65–99)
Potassium: 4.2 mmol/L (ref 3.5–5.2)
Sodium: 141 mmol/L (ref 134–144)

## 2016-12-13 ENCOUNTER — Other Ambulatory Visit: Payer: Self-pay | Admitting: Family Medicine

## 2016-12-13 MED ORDER — FERROUS SULFATE 325 (65 FE) MG PO TABS: 325.0000 mg | ORAL_TABLET | Freq: Two times a day (BID) | ORAL | 2 refills | Status: DC

## 2016-12-21 ENCOUNTER — Encounter: Payer: Self-pay | Admitting: *Deleted

## 2017-01-11 DIAGNOSIS — G894 Chronic pain syndrome: Secondary | ICD-10-CM | POA: Diagnosis not present

## 2017-01-11 DIAGNOSIS — F1129 Opioid dependence with unspecified opioid-induced disorder: Secondary | ICD-10-CM | POA: Diagnosis not present

## 2017-01-11 DIAGNOSIS — Z79891 Long term (current) use of opiate analgesic: Secondary | ICD-10-CM | POA: Diagnosis not present

## 2017-02-08 DIAGNOSIS — F1129 Opioid dependence with unspecified opioid-induced disorder: Secondary | ICD-10-CM | POA: Diagnosis not present

## 2017-02-08 DIAGNOSIS — G894 Chronic pain syndrome: Secondary | ICD-10-CM | POA: Diagnosis not present

## 2017-02-08 DIAGNOSIS — Z79891 Long term (current) use of opiate analgesic: Secondary | ICD-10-CM | POA: Diagnosis not present

## 2017-02-21 ENCOUNTER — Emergency Department (HOSPITAL_COMMUNITY)
Admission: EM | Admit: 2017-02-21 | Discharge: 2017-02-21 | Disposition: A | Discharge status: Home / Self Care | Payer: BLUE CROSS/BLUE SHIELD | Attending: Emergency Medicine | Admitting: Emergency Medicine

## 2017-02-21 ENCOUNTER — Other Ambulatory Visit: Payer: Self-pay

## 2017-02-21 ENCOUNTER — Encounter (HOSPITAL_COMMUNITY): Payer: Self-pay | Admitting: Emergency Medicine

## 2017-02-21 DIAGNOSIS — Z79899 Other long term (current) drug therapy: Secondary | ICD-10-CM | POA: Insufficient documentation

## 2017-02-21 DIAGNOSIS — R531 Weakness: Secondary | ICD-10-CM | POA: Insufficient documentation

## 2017-02-21 DIAGNOSIS — R0989 Other specified symptoms and signs involving the circulatory and respiratory systems: Secondary | ICD-10-CM | POA: Diagnosis not present

## 2017-02-21 DIAGNOSIS — R05 Cough: Secondary | ICD-10-CM | POA: Diagnosis not present

## 2017-02-21 DIAGNOSIS — B9789 Other viral agents as the cause of diseases classified elsewhere: Secondary | ICD-10-CM | POA: Insufficient documentation

## 2017-02-21 DIAGNOSIS — F17210 Nicotine dependence, cigarettes, uncomplicated: Secondary | ICD-10-CM | POA: Insufficient documentation

## 2017-02-21 DIAGNOSIS — M791 Myalgia, unspecified site: Secondary | ICD-10-CM | POA: Diagnosis not present

## 2017-02-21 DIAGNOSIS — J069 Acute upper respiratory infection, unspecified: Secondary | ICD-10-CM | POA: Diagnosis not present

## 2017-02-21 DIAGNOSIS — J029 Acute pharyngitis, unspecified: Secondary | ICD-10-CM

## 2017-02-21 DIAGNOSIS — R070 Pain in throat: Secondary | ICD-10-CM | POA: Diagnosis present

## 2017-02-21 LAB — RAPID STREP SCREEN (MED CTR MEBANE ONLY): Streptococcus, Group A Screen (Direct): NEGATIVE

## 2017-02-21 MED ORDER — LIDOCAINE VISCOUS 2 % MT SOLN: 5.0000 mL | Freq: Four times a day (QID) | OROMUCOSAL | 0 refills | Status: DC | PRN

## 2017-02-21 NOTE — ED Triage Notes (Signed)
Sore throat, runny nose and weakness x past 2 days.  Pt states he is supposed to work tonight, but he cant go in feeling like this.

## 2017-02-21 NOTE — Discharge Instructions (Signed)
Use the lidocaine swish and swallow  / gargle medicine every 6 hours as needed Tylenol for fevers or aches ER for worsening symtpoms You do NOT have strep throat

## 2017-02-21 NOTE — ED Provider Notes (Signed)
[facility] Provider Note   CSN: [id number] Arrival date & time: 02/21/17  1911     History   Chief Complaint Chief Complaint  Patient presents with  . Sore Throat    HPI Shane Stone is a 31 y.o. male.  HPI  The patient is a 31 year old male, he does report a history of Crohn's disease but denies any other significant medical history, he presents today with a complaint of a sore throat which is been going on for more than 48 hours and is now associated with some runny nose, some body aches and generalized weakness as well as a cough which is nonproductive.  He has occasional phlegm but mostly dry.  He denies swelling of the legs, denies objective fevers or chills but has been taking NyQuil and DayQuil.  No known sick contacts.  Symptoms are persistent.  Past Medical History:  Diagnosis Date  . Crohn's     Patient Active Problem List   Diagnosis Date Noted  . Bacteremia due to Streptococcus   . Hemorrhagic shock (HCC) 03/17/2014  . GIB (gastrointestinal bleeding) 03/16/2014  . Duodenal ulcer hemorrhagic 03/16/2014  . Crohn's disease of ileum with abscess (HCC) 10/30/2013    Past Surgical History:  Procedure Laterality Date  . ESOPHAGOGASTRODUODENOSCOPY N/A 03/16/2014   Procedure: ESOPHAGOGASTRODUODENOSCOPY (EGD);  Surgeon: Hilarie FredricksonJohn N Perry, MD;  [facility];  Service: Endoscopy;  Laterality: N/A;  bedside  . MOUTH SURGERY  2010  . SMALL INTESTINE SURGERY     from infection. no intestines removed.        Home Medications    Prior to Admission medications   Medication Sig Start Date End Date Taking? Authorizing Provider  acetaminophen (TYLENOL) 500 MG tablet Take 500-1,000 mg by mouth daily as needed for mild pain or moderate pain.    [provider]  ferrous sulfate 325 (65 FE) MG tablet Take 1 tablet (325 mg total) by mouth 2 (two) times daily with a meal. 12/13/16   Gottschalk, Ashly M, DO  lidocaine (XYLOCAINE) 2 %  solution Use as directed 5 mLs in the mouth or throat every 6 (six) hours as needed for mouth pain. Gargle, swish and swallow 02/21/17   Eber HongMiller, Margerite Impastato, MD  ondansetron (ZOFRAN ODT) 4 MG disintegrating tablet Take 1 tablet (4 mg total) by mouth every 8 (eight) hours as needed for nausea or vomiting. 12/10/16   Raliegh IpGottschalk, Ashly M, DO    Family History Family History  Problem Relation Age of Onset  . Colon cancer Neg Hx   . Crohn's disease Neg Hx   . Inflammatory bowel disease Neg Hx     Social History Social History   Tobacco Use  . Smoking status: Current Every Day Smoker    Packs/day: 1.00    Years: 9.00    Pack years: 9.00    Types: Cigarettes  . Smokeless tobacco: Never Used  Substance Use Topics  . Alcohol use: No  . Drug use: No     Allergies   Nsaids   Review of Systems Review of Systems  Constitutional: Negative for chills and fever.  HENT: Positive for rhinorrhea and sore throat.   Eyes: Negative for redness.  Respiratory: Positive for cough. Negative for shortness of breath.   Gastrointestinal: Negative for diarrhea and vomiting.     Physical Exam Updated Vital Signs BP 106/69 (BP Location: Right Arm)   Pulse 89   Temp 98.6 F (37 C) (Oral)   Resp 18  Ht 5\' 7"  (1.702 m)   Wt 60.8 kg (134 lb)   SpO2 100%   BMI 20.99 kg/m   Physical Exam  Constitutional: He appears well-developed and well-nourished.  HENT:  Head: Normocephalic and atraumatic.  The patient has some mild elongation of the uvula, tonsils are not seen, the back of the pharynx is totally clear and open and phonation is normal with a normal appearing mucous membranes.  There is no lesions, no pustular exudates, no vesicular lesions.  Eyes: Conjunctivae are normal. Right eye exhibits no discharge. Left eye exhibits no discharge.  Neck:  There is no lymphadenopathy of the neck, he has no trismus torticollis and has a very supple neck.  Cardiovascular:  Normal rate and rhythm and  peripheral pulses  Pulmonary/Chest: Effort normal. No respiratory distress.  Neurological: He is alert. Coordination normal.  Skin: Skin is warm and dry. No rash noted. He is not diaphoretic. No erythema.  Psychiatric: He has a normal mood and affect.  Nursing note and vitals reviewed.    ED Treatments / Results  Labs (all labs ordered are listed, but only abnormal results are displayed) Labs Reviewed  RAPID STREP SCREEN (NOT AT [facility])  CULTURE, GROUP A STREP [facility])     Radiology No results found.  Procedures Procedures (including critical care time)  Medications Ordered in ED Medications - No data to display   Initial Impression / Assessment and Plan / ED Course  I have reviewed the triage vital signs and the nursing notes.  Pertinent labs & imaging results that were available during my care of the patient were reviewed by me and considered in my medical decision making (see chart for details).     Well-appearing, rapid strep pending, likely viral Strep neg  Vitals:   02/21/17 1919  BP: 106/69  Pulse: 89  Resp: 18  Temp: 98.6 F (37 C)  TempSrc: Oral  SpO2: 100%  Weight: 60.8 kg (134 lb)  Height: 5\' 7"  (1.702 m)    Stable for d/c  Final Clinical Impressions(s) / ED Diagnoses   Final diagnoses:  Viral URI with cough  Acute pharyngitis, unspecified etiology    ED Discharge Orders        Ordered    lidocaine (XYLOCAINE) 2 % solution  Every 6 hours PRN     02/21/17 2205       Eber Hong, MD 02/21/17 2206

## 2017-02-24 LAB — CULTURE, GROUP A STREP (THRC)

## 2017-03-08 DIAGNOSIS — F1129 Opioid dependence with unspecified opioid-induced disorder: Secondary | ICD-10-CM | POA: Diagnosis not present

## 2017-03-08 DIAGNOSIS — F411 Generalized anxiety disorder: Secondary | ICD-10-CM | POA: Diagnosis not present

## 2017-03-08 DIAGNOSIS — Z79891 Long term (current) use of opiate analgesic: Secondary | ICD-10-CM | POA: Diagnosis not present

## 2017-03-08 DIAGNOSIS — G894 Chronic pain syndrome: Secondary | ICD-10-CM | POA: Diagnosis not present

## 2017-05-03 DIAGNOSIS — Z79891 Long term (current) use of opiate analgesic: Secondary | ICD-10-CM | POA: Diagnosis not present

## 2017-05-03 DIAGNOSIS — G894 Chronic pain syndrome: Secondary | ICD-10-CM | POA: Diagnosis not present

## 2017-06-14 DIAGNOSIS — G894 Chronic pain syndrome: Secondary | ICD-10-CM | POA: Diagnosis not present

## 2017-06-14 DIAGNOSIS — F1129 Opioid dependence with unspecified opioid-induced disorder: Secondary | ICD-10-CM | POA: Diagnosis not present

## 2017-06-14 DIAGNOSIS — Z79891 Long term (current) use of opiate analgesic: Secondary | ICD-10-CM | POA: Diagnosis not present

## 2017-08-18 DIAGNOSIS — M461 Sacroiliitis, not elsewhere classified: Secondary | ICD-10-CM | POA: Diagnosis not present

## 2017-08-18 DIAGNOSIS — F1129 Opioid dependence with unspecified opioid-induced disorder: Secondary | ICD-10-CM | POA: Diagnosis not present

## 2017-08-18 DIAGNOSIS — G894 Chronic pain syndrome: Secondary | ICD-10-CM | POA: Diagnosis not present

## 2017-08-18 DIAGNOSIS — F411 Generalized anxiety disorder: Secondary | ICD-10-CM | POA: Diagnosis not present

## 2017-10-25 DIAGNOSIS — M461 Sacroiliitis, not elsewhere classified: Secondary | ICD-10-CM | POA: Diagnosis not present

## 2017-10-25 DIAGNOSIS — F1129 Opioid dependence with unspecified opioid-induced disorder: Secondary | ICD-10-CM | POA: Diagnosis not present

## 2017-10-25 DIAGNOSIS — Z79891 Long term (current) use of opiate analgesic: Secondary | ICD-10-CM | POA: Diagnosis not present

## 2017-10-25 DIAGNOSIS — G894 Chronic pain syndrome: Secondary | ICD-10-CM | POA: Diagnosis not present

## 2018-01-24 DIAGNOSIS — M461 Sacroiliitis, not elsewhere classified: Secondary | ICD-10-CM | POA: Diagnosis not present

## 2018-01-24 DIAGNOSIS — F411 Generalized anxiety disorder: Secondary | ICD-10-CM | POA: Diagnosis not present

## 2018-01-24 DIAGNOSIS — Z79891 Long term (current) use of opiate analgesic: Secondary | ICD-10-CM | POA: Diagnosis not present

## 2018-01-24 DIAGNOSIS — F1129 Opioid dependence with unspecified opioid-induced disorder: Secondary | ICD-10-CM | POA: Diagnosis not present

## 2018-03-27 DIAGNOSIS — F411 Generalized anxiety disorder: Secondary | ICD-10-CM | POA: Diagnosis not present

## 2018-03-27 DIAGNOSIS — M461 Sacroiliitis, not elsewhere classified: Secondary | ICD-10-CM | POA: Diagnosis not present

## 2018-03-27 DIAGNOSIS — Z79891 Long term (current) use of opiate analgesic: Secondary | ICD-10-CM | POA: Diagnosis not present

## 2018-03-27 DIAGNOSIS — F1129 Opioid dependence with unspecified opioid-induced disorder: Secondary | ICD-10-CM | POA: Diagnosis not present

## 2018-07-25 DIAGNOSIS — F1129 Opioid dependence with unspecified opioid-induced disorder: Secondary | ICD-10-CM | POA: Diagnosis not present

## 2018-07-25 DIAGNOSIS — Z79891 Long term (current) use of opiate analgesic: Secondary | ICD-10-CM | POA: Diagnosis not present

## 2018-07-25 DIAGNOSIS — M461 Sacroiliitis, not elsewhere classified: Secondary | ICD-10-CM | POA: Diagnosis not present

## 2018-07-25 DIAGNOSIS — F419 Anxiety disorder, unspecified: Secondary | ICD-10-CM | POA: Diagnosis not present

## 2018-11-20 DIAGNOSIS — F1129 Opioid dependence with unspecified opioid-induced disorder: Secondary | ICD-10-CM | POA: Diagnosis not present

## 2018-11-20 DIAGNOSIS — F411 Generalized anxiety disorder: Secondary | ICD-10-CM | POA: Diagnosis not present

## 2018-11-20 DIAGNOSIS — Z79891 Long term (current) use of opiate analgesic: Secondary | ICD-10-CM | POA: Diagnosis not present

## 2018-11-20 DIAGNOSIS — M461 Sacroiliitis, not elsewhere classified: Secondary | ICD-10-CM | POA: Diagnosis not present

## 2019-01-15 DIAGNOSIS — M461 Sacroiliitis, not elsewhere classified: Secondary | ICD-10-CM | POA: Diagnosis not present

## 2019-01-15 DIAGNOSIS — F411 Generalized anxiety disorder: Secondary | ICD-10-CM | POA: Diagnosis not present

## 2019-01-15 DIAGNOSIS — F1129 Opioid dependence with unspecified opioid-induced disorder: Secondary | ICD-10-CM | POA: Diagnosis not present

## 2019-01-15 DIAGNOSIS — Z79891 Long term (current) use of opiate analgesic: Secondary | ICD-10-CM | POA: Diagnosis not present

## 2020-02-13 DIAGNOSIS — Z79891 Long term (current) use of opiate analgesic: Secondary | ICD-10-CM | POA: Diagnosis not present

## 2020-02-13 DIAGNOSIS — G894 Chronic pain syndrome: Secondary | ICD-10-CM | POA: Diagnosis not present

## 2020-02-13 DIAGNOSIS — M5459 Other low back pain: Secondary | ICD-10-CM | POA: Diagnosis not present

## 2020-02-13 DIAGNOSIS — F411 Generalized anxiety disorder: Secondary | ICD-10-CM | POA: Diagnosis not present

## 2020-02-13 DIAGNOSIS — F1120 Opioid dependence, uncomplicated: Secondary | ICD-10-CM | POA: Diagnosis not present

## 2020-04-09 DIAGNOSIS — F1120 Opioid dependence, uncomplicated: Secondary | ICD-10-CM | POA: Diagnosis not present

## 2020-04-09 DIAGNOSIS — G894 Chronic pain syndrome: Secondary | ICD-10-CM | POA: Diagnosis not present

## 2020-04-09 DIAGNOSIS — M5459 Other low back pain: Secondary | ICD-10-CM | POA: Diagnosis not present

## 2020-04-09 DIAGNOSIS — Z79891 Long term (current) use of opiate analgesic: Secondary | ICD-10-CM | POA: Diagnosis not present

## 2020-04-09 DIAGNOSIS — F411 Generalized anxiety disorder: Secondary | ICD-10-CM | POA: Diagnosis not present

## 2020-06-04 DIAGNOSIS — Z79891 Long term (current) use of opiate analgesic: Secondary | ICD-10-CM | POA: Diagnosis not present

## 2020-06-04 DIAGNOSIS — F1120 Opioid dependence, uncomplicated: Secondary | ICD-10-CM | POA: Diagnosis not present

## 2020-06-04 DIAGNOSIS — F411 Generalized anxiety disorder: Secondary | ICD-10-CM | POA: Diagnosis not present

## 2020-06-04 DIAGNOSIS — M5416 Radiculopathy, lumbar region: Secondary | ICD-10-CM | POA: Diagnosis not present

## 2020-06-04 DIAGNOSIS — M5459 Other low back pain: Secondary | ICD-10-CM | POA: Diagnosis not present

## 2020-06-04 DIAGNOSIS — F1129 Opioid dependence with unspecified opioid-induced disorder: Secondary | ICD-10-CM | POA: Diagnosis not present

## 2020-07-30 DIAGNOSIS — Z79891 Long term (current) use of opiate analgesic: Secondary | ICD-10-CM | POA: Diagnosis not present

## 2020-07-30 DIAGNOSIS — M5459 Other low back pain: Secondary | ICD-10-CM | POA: Diagnosis not present

## 2020-07-30 DIAGNOSIS — F1120 Opioid dependence, uncomplicated: Secondary | ICD-10-CM | POA: Diagnosis not present

## 2020-07-30 DIAGNOSIS — F411 Generalized anxiety disorder: Secondary | ICD-10-CM | POA: Diagnosis not present

## 2020-09-30 DIAGNOSIS — Z79891 Long term (current) use of opiate analgesic: Secondary | ICD-10-CM | POA: Diagnosis not present

## 2020-09-30 DIAGNOSIS — F411 Generalized anxiety disorder: Secondary | ICD-10-CM | POA: Diagnosis not present

## 2020-09-30 DIAGNOSIS — F1120 Opioid dependence, uncomplicated: Secondary | ICD-10-CM | POA: Diagnosis not present

## 2020-09-30 DIAGNOSIS — M5459 Other low back pain: Secondary | ICD-10-CM | POA: Diagnosis not present

## 2020-12-31 DIAGNOSIS — M5459 Other low back pain: Secondary | ICD-10-CM | POA: Diagnosis not present

## 2020-12-31 DIAGNOSIS — G894 Chronic pain syndrome: Secondary | ICD-10-CM | POA: Diagnosis not present

## 2020-12-31 DIAGNOSIS — F1120 Opioid dependence, uncomplicated: Secondary | ICD-10-CM | POA: Diagnosis not present

## 2020-12-31 DIAGNOSIS — Z79891 Long term (current) use of opiate analgesic: Secondary | ICD-10-CM | POA: Diagnosis not present

## 2020-12-31 DIAGNOSIS — F411 Generalized anxiety disorder: Secondary | ICD-10-CM | POA: Diagnosis not present

## 2021-03-24 DIAGNOSIS — F1120 Opioid dependence, uncomplicated: Secondary | ICD-10-CM | POA: Diagnosis not present

## 2021-03-24 DIAGNOSIS — G894 Chronic pain syndrome: Secondary | ICD-10-CM | POA: Diagnosis not present

## 2021-03-24 DIAGNOSIS — Z79891 Long term (current) use of opiate analgesic: Secondary | ICD-10-CM | POA: Diagnosis not present

## 2021-06-08 DIAGNOSIS — H40033 Anatomical narrow angle, bilateral: Secondary | ICD-10-CM | POA: Diagnosis not present

## 2021-06-08 DIAGNOSIS — H04123 Dry eye syndrome of bilateral lacrimal glands: Secondary | ICD-10-CM | POA: Diagnosis not present

## 2021-06-10 DIAGNOSIS — F1129 Opioid dependence with unspecified opioid-induced disorder: Secondary | ICD-10-CM | POA: Diagnosis not present

## 2021-06-10 DIAGNOSIS — F411 Generalized anxiety disorder: Secondary | ICD-10-CM | POA: Diagnosis not present

## 2021-06-10 DIAGNOSIS — G894 Chronic pain syndrome: Secondary | ICD-10-CM | POA: Diagnosis not present

## 2021-06-10 DIAGNOSIS — M461 Sacroiliitis, not elsewhere classified: Secondary | ICD-10-CM | POA: Diagnosis not present

## 2021-06-10 DIAGNOSIS — Z79891 Long term (current) use of opiate analgesic: Secondary | ICD-10-CM | POA: Diagnosis not present

## 2021-06-24 DIAGNOSIS — Z6824 Body mass index (BMI) 24.0-24.9, adult: Secondary | ICD-10-CM | POA: Diagnosis not present

## 2021-06-24 DIAGNOSIS — M5441 Lumbago with sciatica, right side: Secondary | ICD-10-CM | POA: Diagnosis not present

## 2021-07-16 ENCOUNTER — Ambulatory Visit: Payer: BLUE CROSS/BLUE SHIELD | Admitting: Family Medicine

## 2021-08-04 ENCOUNTER — Ambulatory Visit: Payer: BLUE CROSS/BLUE SHIELD | Admitting: Family Medicine

## 2021-09-01 DIAGNOSIS — Z79891 Long term (current) use of opiate analgesic: Secondary | ICD-10-CM | POA: Diagnosis not present

## 2021-09-01 DIAGNOSIS — G894 Chronic pain syndrome: Secondary | ICD-10-CM | POA: Diagnosis not present

## 2021-09-01 DIAGNOSIS — M461 Sacroiliitis, not elsewhere classified: Secondary | ICD-10-CM | POA: Diagnosis not present

## 2021-09-01 DIAGNOSIS — F1129 Opioid dependence with unspecified opioid-induced disorder: Secondary | ICD-10-CM | POA: Diagnosis not present

## 2021-09-01 DIAGNOSIS — F411 Generalized anxiety disorder: Secondary | ICD-10-CM | POA: Diagnosis not present

## 2021-09-08 ENCOUNTER — Ambulatory Visit: Payer: BLUE CROSS/BLUE SHIELD | Admitting: Family Medicine

## 2021-11-17 DIAGNOSIS — Z79891 Long term (current) use of opiate analgesic: Secondary | ICD-10-CM | POA: Diagnosis not present

## 2021-11-17 DIAGNOSIS — M461 Sacroiliitis, not elsewhere classified: Secondary | ICD-10-CM | POA: Diagnosis not present

## 2021-11-17 DIAGNOSIS — F411 Generalized anxiety disorder: Secondary | ICD-10-CM | POA: Diagnosis not present

## 2021-11-17 DIAGNOSIS — F1129 Opioid dependence with unspecified opioid-induced disorder: Secondary | ICD-10-CM | POA: Diagnosis not present

## 2021-12-31 ENCOUNTER — Ambulatory Visit (INDEPENDENT_AMBULATORY_CARE_PROVIDER_SITE_OTHER): Payer: BC Managed Care – PPO | Admitting: Family Medicine

## 2021-12-31 ENCOUNTER — Encounter: Payer: Self-pay | Admitting: Family Medicine

## 2021-12-31 VITALS — BP 116/74 | HR 89 | Temp 99.0°F | Ht 66.0 in | Wt 157.6 lb

## 2021-12-31 DIAGNOSIS — Z972 Presence of dental prosthetic device (complete) (partial): Secondary | ICD-10-CM

## 2021-12-31 DIAGNOSIS — F411 Generalized anxiety disorder: Secondary | ICD-10-CM | POA: Diagnosis not present

## 2021-12-31 DIAGNOSIS — R6889 Other general symptoms and signs: Secondary | ICD-10-CM | POA: Diagnosis not present

## 2021-12-31 DIAGNOSIS — K5090 Crohn's disease, unspecified, without complications: Secondary | ICD-10-CM | POA: Insufficient documentation

## 2021-12-31 DIAGNOSIS — F1111 Opioid abuse, in remission: Secondary | ICD-10-CM | POA: Diagnosis not present

## 2021-12-31 DIAGNOSIS — K08109 Complete loss of teeth, unspecified cause, unspecified class: Secondary | ICD-10-CM | POA: Insufficient documentation

## 2021-12-31 DIAGNOSIS — K5080 Crohn's disease of both small and large intestine without complications: Secondary | ICD-10-CM

## 2021-12-31 NOTE — Progress Notes (Signed)
Subjective:  Patient ID: Shane Stone, male    DOB: [birth year], 36 y.o.   MRN: [id number]  Patient Care Team: Baruch Gouty, FNP as PCP - General (Family Medicine) Danie Binder, MD (Inactive) (Gastroenterology)   Chief Complaint:  New Patient (Initial Visit) (Previous [facility] patient ) and Establish Care   HPI: Shane Stone is a 36 y.o. male presenting on 12/31/2021 for New Patient (Initial Visit) (Previous [facility] patient ) and Establish Care   Pt presents today to establish care with new PCP. He is followed by neurology on a regular basis for anxiety and prior opioid abuse. He is prescribed Valium and Suboxone by neurology. He has Crohn's Disease and has not seen GI in several years. Currently not on any medications, states he controls with diet. Was on Remicade in the past. His biggest concern today is worsening anxiety. He reports he has tried and failed several antianxiety medications without great results. He denies seeing psychiatry in the past. Does speak to his church counselor on a regular basis.       12/31/2021    2:25 PM  GAD 7 : Generalized Anxiety Score  Nervous, Anxious, on Edge 3  Control/stop worrying 1  Worry too much - different things 1  Trouble relaxing 1  Restless 0  Easily annoyed or irritable 0  Afraid - awful might happen 0  Total GAD 7 Score 6  Anxiety Difficulty Somewhat difficult       12/31/2021    2:25 PM 12/10/2016   10:33 AM 05/16/2015    9:36 AM  Depression screen PHQ 2/9  Decreased Interest 0 2 1  Down, Depressed, Hopeless 0 1 2  PHQ - 2 Score 0 3 3  Altered sleeping 0 1 1  Tired, decreased energy 0 3 2  Change in appetite 0 1 1  Feeling bad or failure about yourself  0 0 1  Trouble concentrating 0 2 1  Moving slowly or fidgety/restless 0  0  Suicidal thoughts 0 0 0  PHQ-9 Score 0 10 9  Difficult doing work/chores Not difficult at all          Relevant past medical, surgical, family, and social history reviewed and updated  as indicated.  Allergies and medications reviewed and updated. Data reviewed: Chart in Epic.   Past Medical History:  Diagnosis Date   Anxiety    Crohn's     Past Surgical History:  Procedure Laterality Date   ESOPHAGOGASTRODUODENOSCOPY N/A 03/16/2014   Procedure: ESOPHAGOGASTRODUODENOSCOPY (EGD);  Surgeon: Irene Shipper, MD;  Location: [facility] ENDOSCOPY;  Service: Endoscopy;  Laterality: N/A;  bedside   MOUTH SURGERY  2010   SMALL INTESTINE SURGERY     from infection. no intestines removed.     Social History   Socioeconomic History   Marital status: Single    Spouse name: Not on file   Number of children: Not on file   Years of education: Not on file   Highest education level: Not on file  Occupational History   Not on file  Tobacco Use   Smoking status: Former    Years: 9.00    Types: Cigarettes    Quit date: 2020    Years since quitting: 3.8   Smokeless tobacco: Never  Vaping Use   Vaping Use: Every day   Substances: Flavoring  Substance and Sexual Activity   Alcohol use: No   Drug use: No   Sexual activity: Yes  Partners: Female    Birth control/protection: Condom  Other Topics Concern   Not on file  Social History Narrative   Not on file   Social Determinants of Health   Financial Resource Strain: Not on file  Food Insecurity: Not on file  Transportation Needs: Not on file  Physical Activity: Not on file  Stress: Not on file  Social Connections: Not on file  Intimate Partner Violence: Not on file    Outpatient Encounter Medications as of 12/31/2021  Medication Sig   acetaminophen (TYLENOL) 500 MG tablet Take 500-1,000 mg by mouth daily as needed for mild pain or moderate pain.   Buprenorphine HCl-Naloxone HCl 8-2 MG FILM Place 1.5 Film under the tongue daily.   diazepam (VALIUM) 5 MG tablet Take 5 mg by mouth.   [DISCONTINUED] ferrous sulfate 325 (65 FE) MG tablet Take 1 tablet (325 mg total) by mouth 2 (two) times daily with a meal.   [DISCONTINUED]  lidocaine (XYLOCAINE) 2 % solution Use as directed 5 mLs in the mouth or throat every 6 (six) hours as needed for mouth pain. Gargle, swish and swallow   [DISCONTINUED] ondansetron (ZOFRAN ODT) 4 MG disintegrating tablet Take 1 tablet (4 mg total) by mouth every 8 (eight) hours as needed for nausea or vomiting.   No facility-administered encounter medications on file as of 12/31/2021.    Allergies  Allergen Reactions   Nsaids Other (See Comments)    Cannot take due to Crohns  GI upset    Review of Systems  Constitutional:  Negative for activity change, appetite change, chills, diaphoresis, fatigue, fever and unexpected weight change.  HENT: Negative.    Eyes: Negative.   Respiratory:  Negative for cough, chest tightness and shortness of breath.   Cardiovascular:  Negative for chest pain, palpitations and leg swelling.  Gastrointestinal:  Negative for abdominal pain, blood in stool, constipation, diarrhea, nausea and vomiting.  Endocrine: Negative.   Genitourinary:  Negative for decreased urine volume, difficulty urinating, dysuria, frequency and urgency.  Musculoskeletal:  Negative for arthralgias and myalgias.  Skin: Negative.   Allergic/Immunologic: Negative.   Neurological:  Negative for dizziness and headaches.  Hematological: Negative.   Psychiatric/Behavioral:  Negative for agitation, behavioral problems, confusion, decreased concentration, dysphoric mood, hallucinations, self-injury, sleep disturbance and suicidal ideas. The patient is nervous/anxious and is hyperactive.   All other systems reviewed and are negative.       Objective:  BP 116/74   Pulse 89   Temp 99 F (37.2 C) (Temporal)   Ht _0  (1.676 m)   Wt 157 lb 9.6 oz (71.5 kg)   SpO2 98%   BMI 25.44 kg/m    Wt Readings from Last 3 Encounters:  12/31/21 157 lb 9.6 oz (71.5 kg)  02/21/17 134 lb (60.8 kg)  12/10/16 134 lb (60.8 kg)    Physical Exam Vitals and nursing note reviewed.  Constitutional:       General: He is not in acute distress.    Appearance: Normal appearance. He is well-developed, well-groomed and normal weight. He is not ill-appearing, toxic-appearing or diaphoretic.  HENT:     Head: Normocephalic and atraumatic.     Jaw: There is normal jaw occlusion.     Right Ear: Hearing normal.     Left Ear: Hearing normal.     Nose: Nose normal.     Mouth/Throat:     Lips: Pink.     Mouth: Mucous membranes are moist.     Dentition: Has dentures (  upper and lower).     Pharynx: Uvula midline.  Eyes:     General: Lids are normal.     Conjunctiva/sclera: Conjunctivae normal.     Pupils: Pupils are equal, round, and reactive to light.  Neck:     Thyroid: No thyroid mass, thyromegaly or thyroid tenderness.     Vascular: No carotid bruit or JVD.     Trachea: Trachea and phonation normal.  Cardiovascular:     Rate and Rhythm: Normal rate and regular rhythm.     Chest Wall: PMI is not displaced.     Pulses: Normal pulses.     Heart sounds: Normal heart sounds. No murmur heard.    No friction rub. No gallop.  Pulmonary:     Effort: Pulmonary effort is normal. No respiratory distress.     Breath sounds: Normal breath sounds. No wheezing.  Abdominal:     General: Bowel sounds are normal. There is no abdominal bruit.     Palpations: Abdomen is soft. There is no hepatomegaly or splenomegaly.  Musculoskeletal:        General: Normal range of motion.     Cervical back: Normal range of motion and neck supple.     Right lower leg: No edema.     Left lower leg: No edema.  Lymphadenopathy:     Cervical: No cervical adenopathy.  Skin:    General: Skin is warm and dry.     Capillary Refill: Capillary refill takes less than 2 seconds.     Coloration: Skin is not cyanotic, jaundiced or pale.     Findings: No rash.  Neurological:     General: No focal deficit present.     Mental Status: He is alert and oriented to person, place, and time.     Sensory: Sensation is intact.      Motor: Tremor present.     Coordination: Coordination is intact.     Gait: Gait is intact.     Deep Tendon Reflexes: Reflexes are normal and symmetric.  Psychiatric:        Attention and Perception: Attention and perception normal.        Mood and Affect: Affect normal. Mood is anxious.        Speech: Speech normal.        Behavior: Behavior normal. Behavior is cooperative.        Thought Content: Thought content normal.        Cognition and Memory: Cognition and memory normal.        Judgment: Judgment normal.     Results for orders placed or performed during the hospital encounter of 02/21/17  Rapid strep screen   Specimen: Other  Result Value Ref Range   Streptococcus, Group A Screen (Direct) NEGATIVE NEGATIVE  Culture, group A strep   Specimen: Throat  Result Value Ref Range   Specimen Description THROAT    Special Requests NONE Reflexed from [id number]    Culture      NO GROUP A STREP (S.PYOGENES) ISOLATED Performed at [facility] [address] [place], [place] [address]    Report Status 02/24/2017 FINAL        Pertinent labs & imaging results that were available during my care of the patient were reviewed by me and considered in my medical decision making.  Assessment & Plan:  Yu was seen today for new patient (initial visit) and establish care.  Diagnoses and all orders for this visit:  Crohn's disease of  both small and large intestine without complication ([place]) Has not been evaluated by GI in years. States he manages with diet. Denies recent flares. No longer on Remicade.  -     Ambulatory referral to Gastroenterology -     CBC with Differential/Platelet -     CMP14+EGFR  GAD (generalized anxiety disorder) History of opioid abuse ([place]) Has been on Valium for anxiety control. Has not been evaluated by psychiatry. Due to worsening symptoms and reports of multiple failed medications, will place referral today. Does see neurology on a regular basis who  has been prescribing Suboxone and Valium. Aware he will need to continue to see him.  -     Ambulatory referral to Psychiatry -     CBC with Differential/Platelet -     CMP14+EGFR -     Thyroid Panel With TSH  Full dentures Seated well with no complications.     Continue all other maintenance medications.  Follow up plan: Return in about 8 weeks (around 02/25/2022), or if symptoms worsen or fail to improve, for CPE.   Continue healthy lifestyle choices, including diet (rich in fruits, vegetables, and lean proteins, and low in salt and simple carbohydrates) and exercise (at least 30 minutes of moderate physical activity daily).  The above assessment and management plan was discussed with the patient. The patient verbalized understanding of and has agreed to the management plan. Patient is aware to call the clinic if they develop any new symptoms or if symptoms persist or worsen. Patient is aware when to return to the clinic for a follow-up visit. Patient educated on when it is appropriate to go to the emergency department.   Monia Pouch, FNP-C [facility] [phone]

## 2022-01-01 LAB — CMP14+EGFR
ALT: 21 IU/L (ref 0–44)
AST: 21 IU/L (ref 0–40)
Albumin/Globulin Ratio: 1.3 (ref 1.2–2.2)
Albumin: 3.6 g/dL — ABNORMAL LOW (ref 4.1–5.1)
Alkaline Phosphatase: 92 IU/L (ref 44–121)
BUN/Creatinine Ratio: 9 (ref 9–20)
BUN: 9 mg/dL (ref 6–20)
Bilirubin Total: <0.2 mg/dL (ref 0.0–1.2)
CO2: 24 mmol/L (ref 20–29)
Calcium: 8.8 mg/dL (ref 8.7–10.2)
Chloride: 107 mmol/L — ABNORMAL HIGH (ref 96–106)
Creatinine, Ser: 1.01 mg/dL (ref 0.76–1.27)
Globulin, Total: 2.8 g/dL (ref 1.5–4.5)
Glucose: 94 mg/dL (ref 70–99)
Potassium: 3.8 mmol/L (ref 3.5–5.2)
Sodium: 144 mmol/L (ref 134–144)
Total Protein: 6.4 g/dL (ref 6.0–8.5)
eGFR: 98 mL/min/{1.73_m2} (ref >59)

## 2022-01-01 LAB — CBC WITH DIFFERENTIAL/PLATELET
Basophils Absolute: 0 10*3/uL (ref 0.0–0.2)
Basos: 1 %
EOS (ABSOLUTE): 0.1 10*3/uL (ref 0.0–0.4)
Eos: 1 %
Hematocrit: 35 % — ABNORMAL LOW (ref 37.5–51.0)
Hemoglobin: 11.7 g/dL — ABNORMAL LOW (ref 13.0–17.7)
Immature Grans (Abs): 0 10*3/uL (ref 0.0–0.1)
Immature Granulocytes: 0 %
Lymphocytes Absolute: 1.7 10*3/uL (ref 0.7–3.1)
Lymphs: 34 %
MCH: 27.2 pg (ref 26.6–33.0)
MCHC: 33.4 g/dL (ref 31.5–35.7)
MCV: 81 fL (ref 79–97)
Monocytes Absolute: 0.3 10*3/uL (ref 0.1–0.9)
Monocytes: 5 %
Neutrophils Absolute: 3.1 10*3/uL (ref 1.4–7.0)
Neutrophils: 59 %
Platelets: 277 10*3/uL (ref 150–450)
RBC: 4.3 x10E6/uL (ref 4.14–5.80)
RDW: 13.6 % (ref 11.6–15.4)
WBC: 5.2 10*3/uL (ref 3.4–10.8)

## 2022-01-01 LAB — THYROID PANEL WITH TSH
Free Thyroxine Index: 1.8 (ref 1.2–4.9)
T3 Uptake Ratio: 27 % (ref 24–39)
T4, Total: 6.6 ug/dL (ref 4.5–12.0)
TSH: 2.97 u[IU]/mL (ref 0.450–4.500)

## 2022-01-12 ENCOUNTER — Encounter: Payer: Self-pay | Admitting: Internal Medicine

## 2022-01-20 DIAGNOSIS — Z79891 Long term (current) use of opiate analgesic: Secondary | ICD-10-CM | POA: Diagnosis not present

## 2022-01-20 DIAGNOSIS — F1129 Opioid dependence with unspecified opioid-induced disorder: Secondary | ICD-10-CM | POA: Diagnosis not present

## 2022-01-20 DIAGNOSIS — M461 Sacroiliitis, not elsewhere classified: Secondary | ICD-10-CM | POA: Diagnosis not present

## 2022-01-20 DIAGNOSIS — F411 Generalized anxiety disorder: Secondary | ICD-10-CM | POA: Diagnosis not present

## 2022-02-24 ENCOUNTER — Ambulatory Visit: Payer: BLUE CROSS/BLUE SHIELD | Admitting: Internal Medicine

## 2022-03-03 ENCOUNTER — Encounter: Payer: BC Managed Care – PPO | Admitting: Family Medicine

## 2022-03-30 ENCOUNTER — Ambulatory Visit: Payer: BLUE CROSS/BLUE SHIELD | Admitting: Internal Medicine

## 2022-05-14 ENCOUNTER — Encounter: Payer: BC Managed Care – PPO | Admitting: Family Medicine

## 2023-05-31 ENCOUNTER — Telehealth: Payer: Self-pay

## 2023-05-31 NOTE — Telephone Encounter (Signed)
 Care everywhere search

## 2023-12-13 ENCOUNTER — Encounter (INDEPENDENT_AMBULATORY_CARE_PROVIDER_SITE_OTHER): Payer: Self-pay | Admitting: *Deleted

## 2023-12-30 ENCOUNTER — Ambulatory Visit (INDEPENDENT_AMBULATORY_CARE_PROVIDER_SITE_OTHER): Admitting: Gastroenterology

## 2024-01-25 ENCOUNTER — Ambulatory Visit (INDEPENDENT_AMBULATORY_CARE_PROVIDER_SITE_OTHER): Admitting: Gastroenterology

## 2024-02-14 ENCOUNTER — Ambulatory Visit (INDEPENDENT_AMBULATORY_CARE_PROVIDER_SITE_OTHER): Admitting: Gastroenterology

## 2024-03-02 ENCOUNTER — Ambulatory Visit: Admitting: Physical Therapy

## 2024-03-14 ENCOUNTER — Ambulatory Visit (INDEPENDENT_AMBULATORY_CARE_PROVIDER_SITE_OTHER): Admitting: Gastroenterology

## 2024-03-14 ENCOUNTER — Ambulatory Visit

## 2024-04-04 ENCOUNTER — Ambulatory Visit (INDEPENDENT_AMBULATORY_CARE_PROVIDER_SITE_OTHER): Admitting: Gastroenterology

## 2024-04-04 ENCOUNTER — Ambulatory Visit: Admitting: Physical Therapy

## 2024-04-04 ENCOUNTER — Ambulatory Visit
